# Patient Record
Sex: Male | Born: 1985
Health system: Southern US, Community
[De-identification: ages and names within clinical notes are randomized; demographics above are authoritative.]

## PROBLEM LIST (undated history)

## (undated) DIAGNOSIS — G8929 Other chronic pain: Secondary | ICD-10-CM

## (undated) DIAGNOSIS — W3400XA Accidental discharge from unspecified firearms or gun, initial encounter: Secondary | ICD-10-CM

## (undated) DIAGNOSIS — A64 Unspecified sexually transmitted disease: Secondary | ICD-10-CM

## (undated) DIAGNOSIS — R519 Headache, unspecified: Secondary | ICD-10-CM

## (undated) DIAGNOSIS — R51 Headache: Secondary | ICD-10-CM

## (undated) DIAGNOSIS — I10 Essential (primary) hypertension: Secondary | ICD-10-CM

## (undated) HISTORY — PX: OTHER SURGICAL HISTORY: SHX169

## (undated) HISTORY — PX: WISDOM TOOTH EXTRACTION: SHX21

---

## 2012-06-20 ENCOUNTER — Encounter (HOSPITAL_BASED_OUTPATIENT_CLINIC_OR_DEPARTMENT_OTHER): Payer: Self-pay

## 2012-06-20 ENCOUNTER — Emergency Department (HOSPITAL_BASED_OUTPATIENT_CLINIC_OR_DEPARTMENT_OTHER)
Admission: EM | Admit: 2012-06-20 | Discharge: 2012-06-20 | Disposition: A | Discharge status: Home / Self Care | Payer: Medicaid Other | Attending: Emergency Medicine | Admitting: Emergency Medicine

## 2012-06-20 DIAGNOSIS — Z202 Contact with and (suspected) exposure to infections with a predominantly sexual mode of transmission: Secondary | ICD-10-CM | POA: Insufficient documentation

## 2012-06-20 DIAGNOSIS — N509 Disorder of male genital organs, unspecified: Secondary | ICD-10-CM | POA: Insufficient documentation

## 2012-06-20 DIAGNOSIS — Z8589 Personal history of malignant neoplasm of other organs and systems: Secondary | ICD-10-CM | POA: Insufficient documentation

## 2012-06-20 DIAGNOSIS — F17200 Nicotine dependence, unspecified, uncomplicated: Secondary | ICD-10-CM | POA: Insufficient documentation

## 2012-06-20 DIAGNOSIS — N4889 Other specified disorders of penis: Secondary | ICD-10-CM

## 2012-06-20 DIAGNOSIS — Z79899 Other long term (current) drug therapy: Secondary | ICD-10-CM | POA: Insufficient documentation

## 2012-06-20 HISTORY — DX: Unspecified sexually transmitted disease: A64

## 2012-06-20 NOTE — ED Notes (Signed)
Pt states that he had onset of penile pain yesterday morning at the end of his penis.  Pt states that he had a bump on the end of his penis, but he put neosporin on it and it subsided.  Pt states that severe pain persists.

## 2012-06-20 NOTE — ED Provider Notes (Signed)
History     CSN: [id number]  Arrival date & time 06/20/12  1354   First MD Initiated Contact with Patient 06/20/12 1444      Chief Complaint  Patient presents with  . Penis Pain  . Exposure to STD    (Consider location/radiation/quality/duration/timing/severity/associated sxs/prior treatment) HPI Comments: Patient presents today with a chief complaint of penile pain of the urethral meatus when urinating.  He states that he has noticed the pain at the end of the stream since yesterday.  He has had STD's in the past and reports that his symptoms feel similar to when he has had a STD.  He is currently sexually active and does not use protection.  He is unsure if his partner has a STD.  He denies penile discharge.  No scrotal or testicular pain or swelling.    No fever or chills.    The history is provided by the patient.    Past Medical History  Diagnosis Date  . STD (sexually transmitted disease)     Past Surgical History  Procedure Laterality Date  . Wisdom tooth extraction      No family history on file.  History  Substance Use Topics  . Smoking status: Current Every Day Smoker    Types: Cigars  . Smokeless tobacco: Never Used  . Alcohol Use: No      Review of Systems  Constitutional: Negative for fever and chills.  Genitourinary: Positive for penile pain. Negative for urgency, hematuria, decreased urine volume, discharge, penile swelling, scrotal swelling and testicular pain.    Allergies  Review of patient's allergies indicates no known allergies.  Home Medications   Current Outpatient Rx  Name  Route  Sig  Dispense  Refill  . cetirizine (ZYRTEC) 10 MG tablet   Oral   Take 10 mg by mouth daily.           BP 138/88  Pulse 59  Temp(Src) 98.2 F (36.8 C) (Oral)  Resp 16  Ht 6\' 2"  (1.88 m)  Wt 245 lb (111.131 kg)  BMI 31.44 kg/m2  SpO2 100%  Physical Exam  Nursing note and vitals reviewed. Constitutional: He appears well-developed and  well-nourished.  Cardiovascular: Normal rate and normal heart sounds.   Pulmonary/Chest: Effort normal and breath sounds normal.  Abdominal: Normal appearance and bowel sounds are normal. There is no tenderness.  Genitourinary: Testes normal and penis normal. Right testis shows no mass, no swelling and no tenderness. Left testis shows no mass, no swelling and no tenderness. Circumcised. No penile erythema or penile tenderness. No discharge found.  Chaperone present during GU exam  Lymphadenopathy:       Right: No inguinal adenopathy present.       Left: No inguinal adenopathy present.  Neurological: He is alert.  Skin: Skin is warm and dry.  Psychiatric: He has a normal mood and affect.    ED Course  Procedures (including critical care time)  Labs Reviewed  GC/CHLAMYDIA PROBE AMP   No results found.   No diagnosis found.    MDM  Patient presenting with concern that he may have a STD.  Some pain of the urethral meatus at the end of his urinary stream.  History of the same with STD's.  GC/Chlamydia pending.  He stated that he did not want prophylactic treatment in the ED today and wanted to wait for results.        Pascal Lux [place], PA-C 06/21/12 1020

## 2012-06-22 LAB — GC/CHLAMYDIA PROBE AMP: CT Probe RNA: NEGATIVE

## 2012-06-23 NOTE — ED Provider Notes (Signed)
Medical screening examination/treatment/procedure(s) were performed by non-physician practitioner and as supervising physician I was immediately available for consultation/collaboration.   Zoraida Havrilla, MD 06/23/12 0707 

## 2013-07-28 ENCOUNTER — Emergency Department (HOSPITAL_BASED_OUTPATIENT_CLINIC_OR_DEPARTMENT_OTHER): Payer: Medicaid Other

## 2013-07-28 ENCOUNTER — Encounter (HOSPITAL_BASED_OUTPATIENT_CLINIC_OR_DEPARTMENT_OTHER): Payer: Self-pay | Admitting: Emergency Medicine

## 2013-07-28 ENCOUNTER — Emergency Department (HOSPITAL_BASED_OUTPATIENT_CLINIC_OR_DEPARTMENT_OTHER)
Admission: EM | Admit: 2013-07-28 | Discharge: 2013-07-28 | Disposition: A | Discharge status: Home / Self Care | Payer: Medicaid Other | Attending: Emergency Medicine | Admitting: Emergency Medicine

## 2013-07-28 DIAGNOSIS — F17200 Nicotine dependence, unspecified, uncomplicated: Secondary | ICD-10-CM | POA: Insufficient documentation

## 2013-07-28 DIAGNOSIS — I10 Essential (primary) hypertension: Secondary | ICD-10-CM | POA: Insufficient documentation

## 2013-07-28 DIAGNOSIS — J3489 Other specified disorders of nose and nasal sinuses: Secondary | ICD-10-CM | POA: Insufficient documentation

## 2013-07-28 DIAGNOSIS — J189 Pneumonia, unspecified organism: Secondary | ICD-10-CM | POA: Insufficient documentation

## 2013-07-28 DIAGNOSIS — Z8619 Personal history of other infectious and parasitic diseases: Secondary | ICD-10-CM | POA: Insufficient documentation

## 2013-07-28 HISTORY — DX: Essential (primary) hypertension: I10

## 2013-07-28 MED ORDER — HYDROCODONE-ACETAMINOPHEN 7.5-325 MG/15ML PO SOLN: 10.0000 mL | Freq: Four times a day (QID) | ORAL | Status: DC | PRN

## 2013-07-28 MED ORDER — AZITHROMYCIN 250 MG PO TABS
500.0000 mg | ORAL_TABLET | Freq: Once | ORAL | Status: AC
  Administered 2013-07-28: 500 mg via ORAL
  Filled 2013-07-28: qty 2

## 2013-07-28 MED ORDER — AZITHROMYCIN 250 MG PO TABS: 250.0000 mg | ORAL_TABLET | Freq: Every day | ORAL | Status: DC

## 2013-07-28 MED ORDER — HYDROCOD POLST-CHLORPHEN POLST 10-8 MG/5ML PO LQCR
5.0000 mL | Freq: Once | ORAL | Status: AC
  Administered 2013-07-28: 5 mL via ORAL
  Filled 2013-07-28: qty 5

## 2013-07-28 NOTE — ED Notes (Signed)
Pt presents with cough x 3-4 days.  Pt reports muscle pain in chest.  Denies sick exposure.

## 2013-07-28 NOTE — ED Provider Notes (Signed)
CSN: [id number]     Arrival date & time 07/28/13  1906 History   First MD Initiated Contact with Patient 07/28/13 1918     Chief Complaint  Patient presents with  . URI     (Consider location/radiation/quality/duration/timing/severity/associated sxs/prior Treatment) HPI Comments: 28 year old presents with 5 days of cough and chest tightness.  He has been coughing up green sputum without any blood and has a sore throat.  He has a runny nose, but denies runny eyes or ear pain.  His daughter has recently had a cough.  He says that when he coughs he feels chest tightness, but that it does not radiate up his jaw or down his arm.  He denies SOB, fevers, nausea, vomiting or abdominal pain.  He tried taking benedryl with no relief.  He is unsure as to whether he ever had seasonal allergies. He also states that he has had "a lot of things going on at home recently."   Patient is a 28 y.o. male presenting with URI. The history is provided by the patient. No language interpreter was used.  URI Presenting symptoms: cough, rhinorrhea and sore throat   Presenting symptoms: no congestion, no ear pain and no fever   Associated symptoms: no neck pain and no wheezing       Past Medical History  Diagnosis Date  . STD (sexually transmitted disease)   . Hypertension    Past Surgical History  Procedure Laterality Date  . Wisdom tooth extraction     History reviewed. No pertinent family history. History  Substance Use Topics  . Smoking status: Current Every Day Smoker    Types: Cigars  . Smokeless tobacco: Never Used  . Alcohol Use: No    Review of Systems  Constitutional: Negative for fever, chills and appetite change.  HENT: Positive for rhinorrhea and sore throat. Negative for congestion, ear pain, postnasal drip, sinus pressure and trouble swallowing.   Eyes: Negative for discharge.  Respiratory: Positive for cough and chest tightness. Negative for shortness of breath and wheezing.    Cardiovascular: Positive for chest pain.       Chest pain is worse when he coughs  Gastrointestinal: Negative for nausea, vomiting, diarrhea, constipation and blood in stool.  Musculoskeletal: Negative for neck pain and neck stiffness.  Allergic/Immunologic: Positive for environmental allergies.       He hasn't had allergies in the past.  Neurological: Negative for dizziness, light-headedness and numbness.      Allergies  Review of patient's allergies indicates no known allergies.  Home Medications   Prior to Admission medications   Medication Sig Start Date End Date Taking? Authorizing Provider  cetirizine (ZYRTEC) 10 MG tablet Take 10 mg by mouth daily.    Historical Provider, MD   BP 135/99  Pulse 67  Temp(Src) 98.7 F (37.1 C) (Oral)  Resp 16  Ht 6\' 1"  (1.854 m)  Wt 241 lb (109.317 kg)  BMI 31.80 kg/m2  SpO2 100% Physical Exam  Constitutional: He is oriented to person, place, and time. He appears well-developed and well-nourished. No distress.  HENT:  Head: Normocephalic and atraumatic.  Eyes: Conjunctivae and EOM are normal. Pupils are equal, round, and reactive to light.  Neck: Normal range of motion. Neck supple.  Cardiovascular: Normal rate, regular rhythm and normal heart sounds.   Pulmonary/Chest: Effort normal and breath sounds normal. No respiratory distress. He has no wheezes. He has no rales. He exhibits tenderness.  Chest wall pain is reproducible on palpation  Abdominal: Soft. There is tenderness.  Abdomen is diffusely tender to palpation   Neurological: He is alert and oriented to person, place, and time.  Psychiatric: He has a normal mood and affect. Thought content normal.    ED Course  Procedures (including critical care time) Labs Review Labs Reviewed - No data to display  Imaging Review No results found.   EKG Interpretation None     Dg Chest 2 View  07/28/2013   CLINICAL DATA:  Cough, congestion  EXAM: CHEST  2 VIEW  COMPARISON:   None.  FINDINGS: Left lower lobe retrocardiac focal density noted compatible with left lower lobe pneumonia. Right lung clear. Normal heart size and vascularity. No edema, effusion or pneumothorax. Trachea midline.  IMPRESSION: Left lower lobe retrocardiac pneumonia. Recommend radiographic followup to document resolution following treatment.   Electronically Signed   By: Ruel Favorsrevor  Shick M.D.   On: 07/28/2013 19:40   MDM   Final diagnoses:  None    1. CAP  He is stable, no hypoxia, tachycardia, tachypnea. Abx started, cough medication provided.    Arnoldo HookerShari A Zakeria Kulzer, PA-C 08/01/13 1248

## 2013-07-28 NOTE — Discharge Instructions (Signed)

## 2013-07-28 NOTE — ED Notes (Signed)
Pt c/o URi symptoms with congestion x 1 week,

## 2013-08-02 NOTE — ED Provider Notes (Signed)
Medical screening examination/treatment/procedure(s) were performed by non-physician practitioner and as supervising physician I was immediately available for consultation/collaboration.   EKG Interpretation None        Charles B. Bernette MayersSheldon, MD 08/02/13 1249

## 2014-06-14 ENCOUNTER — Emergency Department (HOSPITAL_BASED_OUTPATIENT_CLINIC_OR_DEPARTMENT_OTHER): Payer: Medicaid Other

## 2014-06-14 ENCOUNTER — Emergency Department (HOSPITAL_BASED_OUTPATIENT_CLINIC_OR_DEPARTMENT_OTHER)
Admission: EM | Admit: 2014-06-14 | Discharge: 2014-06-15 | Disposition: A | Discharge status: Home / Self Care | Payer: Medicaid Other | Attending: Emergency Medicine | Admitting: Emergency Medicine

## 2014-06-14 ENCOUNTER — Encounter (HOSPITAL_BASED_OUTPATIENT_CLINIC_OR_DEPARTMENT_OTHER): Payer: Self-pay | Admitting: *Deleted

## 2014-06-14 DIAGNOSIS — K2920 Alcoholic gastritis without bleeding: Secondary | ICD-10-CM

## 2014-06-14 DIAGNOSIS — Y998 Other external cause status: Secondary | ICD-10-CM | POA: Diagnosis not present

## 2014-06-14 DIAGNOSIS — T7840XA Allergy, unspecified, initial encounter: Secondary | ICD-10-CM | POA: Diagnosis not present

## 2014-06-14 DIAGNOSIS — Y9289 Other specified places as the place of occurrence of the external cause: Secondary | ICD-10-CM | POA: Diagnosis not present

## 2014-06-14 DIAGNOSIS — Z79899 Other long term (current) drug therapy: Secondary | ICD-10-CM | POA: Diagnosis not present

## 2014-06-14 DIAGNOSIS — R079 Chest pain, unspecified: Secondary | ICD-10-CM | POA: Diagnosis present

## 2014-06-14 DIAGNOSIS — Z792 Long term (current) use of antibiotics: Secondary | ICD-10-CM | POA: Insufficient documentation

## 2014-06-14 DIAGNOSIS — Z720 Tobacco use: Secondary | ICD-10-CM | POA: Insufficient documentation

## 2014-06-14 DIAGNOSIS — X58XXXA Exposure to other specified factors, initial encounter: Secondary | ICD-10-CM | POA: Diagnosis not present

## 2014-06-14 DIAGNOSIS — I10 Essential (primary) hypertension: Secondary | ICD-10-CM | POA: Diagnosis not present

## 2014-06-14 DIAGNOSIS — Z8619 Personal history of other infectious and parasitic diseases: Secondary | ICD-10-CM | POA: Insufficient documentation

## 2014-06-14 DIAGNOSIS — R109 Unspecified abdominal pain: Secondary | ICD-10-CM

## 2014-06-14 DIAGNOSIS — Y9389 Activity, other specified: Secondary | ICD-10-CM | POA: Diagnosis not present

## 2014-06-14 DIAGNOSIS — K7010 Alcoholic hepatitis without ascites: Secondary | ICD-10-CM | POA: Insufficient documentation

## 2014-06-14 LAB — COMPREHENSIVE METABOLIC PANEL
ALK PHOS: 182 U/L — AB (ref 39–117)
ALT: 313 U/L — AB (ref 0–53)
AST: 119 U/L — AB (ref 0–37)
Albumin: 4.6 g/dL (ref 3.5–5.2)
Anion gap: 11 (ref 5–15)
BILIRUBIN TOTAL: 4.8 mg/dL — AB (ref 0.3–1.2)
BUN: 8 mg/dL (ref 6–23)
CALCIUM: 9.6 mg/dL (ref 8.4–10.5)
CO2: 27 mmol/L (ref 19–32)
Chloride: 98 mmol/L (ref 96–112)
Creatinine, Ser: 1.04 mg/dL (ref 0.50–1.35)
GFR calc non Af Amer: >90 mL/min (ref >90)
GLUCOSE: 109 mg/dL — AB (ref 70–99)
POTASSIUM: 3.8 mmol/L (ref 3.5–5.1)
SODIUM: 136 mmol/L (ref 135–145)
Total Protein: 9 g/dL — ABNORMAL HIGH (ref 6.0–8.3)

## 2014-06-14 LAB — TROPONIN I

## 2014-06-14 LAB — CBC WITH DIFFERENTIAL/PLATELET
BASOS PCT: 0 % (ref 0–1)
Basophils Absolute: 0 10*3/uL (ref 0.0–0.1)
Eosinophils Absolute: 0.5 10*3/uL (ref 0.0–0.7)
Eosinophils Relative: 4 % (ref 0–5)
HCT: 43.9 % (ref 39.0–52.0)
Hemoglobin: 15.1 g/dL (ref 13.0–17.0)
LYMPHS PCT: 11 % — AB (ref 12–46)
Lymphs Abs: 1.3 10*3/uL (ref 0.7–4.0)
MCH: 29.3 pg (ref 26.0–34.0)
MCHC: 34.4 g/dL (ref 30.0–36.0)
MCV: 85.2 fL (ref 78.0–100.0)
MONOS PCT: 11 % (ref 3–12)
Monocytes Absolute: 1.3 10*3/uL — ABNORMAL HIGH (ref 0.1–1.0)
NEUTROS ABS: 8.1 10*3/uL — AB (ref 1.7–7.7)
Neutrophils Relative %: 74 % (ref 43–77)
PLATELETS: 348 10*3/uL (ref 150–400)
RBC: 5.15 MIL/uL (ref 4.22–5.81)
RDW: 12.8 % (ref 11.5–15.5)
WBC: 11.1 10*3/uL — ABNORMAL HIGH (ref 4.0–10.5)

## 2014-06-14 LAB — LIPASE, BLOOD: Lipase: 23 U/L (ref 11–59)

## 2014-06-14 MED ORDER — PREDNISONE 50 MG PO TABS
60.0000 mg | ORAL_TABLET | Freq: Once | ORAL | Status: AC
  Administered 2014-06-15: 60 mg via ORAL
  Filled 2014-06-14 (×2): qty 1

## 2014-06-14 MED ORDER — PREDNISONE 20 MG PO TABS: 40.0000 mg | ORAL_TABLET | Freq: Every day | ORAL | Status: DC

## 2014-06-14 MED ORDER — DIPHENHYDRAMINE HCL 50 MG/ML IJ SOLN
25.0000 mg | Freq: Once | INTRAMUSCULAR | Status: AC
  Administered 2014-06-15: 25 mg via INTRAVENOUS
  Filled 2014-06-14: qty 1

## 2014-06-14 MED ORDER — GI COCKTAIL ~~LOC~~
30.0000 mL | Freq: Once | ORAL | Status: AC
  Administered 2014-06-14: 30 mL via ORAL
  Filled 2014-06-14: qty 30

## 2014-06-14 MED ORDER — RANITIDINE HCL 150 MG PO TABS: 150.0000 mg | ORAL_TABLET | Freq: Two times a day (BID) | ORAL | Status: DC

## 2014-06-14 MED ORDER — OXYCODONE HCL 5 MG PO TABS: 5.0000 mg | ORAL_TABLET | ORAL | Status: DC | PRN

## 2014-06-14 NOTE — ED Provider Notes (Addendum)
CSN: [id number]     Arrival date & time 06/14/14  2054 History  This chart was scribed for Gwyneth Sprout, MD by Gwenyth Ober, ED Scribe. This patient was seen in room MH10/MH10 and the patient's care was started at 9:12 PM.    Chief Complaint  Patient presents with  . Chest Pain   The history is provided by the patient. No language interpreter was used.    HPI Comments: Sean Castillo is a 29 y.o. male with a history of HTN who presents to the Emergency Department complaining of constant, moderate upper abdominal pain that started earlier today after he ate [employer]. Pt states CP that occurs with belching, constipation, subjective fever and rash on his arms and back as well as nausea but no vomiting today as associated symptoms. He notes the onset of rash started after taking Zofran and shaving his arms. Pt was seen by [facility] yesterday for vomiting and abdominal pain and had a [place] that showed liver damage. He also reports a previous lung test and was told he had "27 year old lungs." Pt states he yells and is stressed a lot. He denies recent surgeries. Pt endorses tobacco and EtOH use, but denies consumption of EtOH PTA. Pt denies drug abuse. He also denies dysuria as associated symptoms.     Past Medical History  Diagnosis Date  . STD (sexually transmitted disease)   . Hypertension    Past Surgical History  Procedure Laterality Date  . Wisdom tooth extraction     No family history on file. History  Substance Use Topics  . Smoking status: Current Every Day Smoker    Types: Cigars  . Smokeless tobacco: Never Used  . Alcohol Use: No    Review of Systems  Constitutional: Positive for fever.  Cardiovascular: Positive for chest pain.  Gastrointestinal: Positive for abdominal pain and constipation. Negative for nausea and vomiting.  Genitourinary: Negative for dysuria.  Skin: Positive for rash.  All other systems reviewed and are negative.   Allergies  Review of patient's  allergies indicates no known allergies.  Home Medications   Prior to Admission medications   Medication Sig Start Date End Date Taking? Authorizing Provider  Ondansetron HCl (ZOFRAN PO) Take by mouth.   Yes Historical Provider, MD  azithromycin (ZITHROMAX Z-PAK) 250 MG tablet Take 1 tablet (250 mg total) by mouth daily. 07/28/13   Elpidio Anis, PA-C  cetirizine (ZYRTEC) 10 MG tablet Take 10 mg by mouth daily.    Historical Provider, MD  HYDROcodone-acetaminophen (HYCET) 7.5-325 mg/15 ml solution Take 10 mLs by mouth 4 (four) times daily as needed for moderate pain. 07/28/13   Shari Upstill, PA-C   BP 149/91 mmHg  Pulse 71  Temp(Src) 99.1 F (37.3 C) (Oral)  Resp 20  Ht  (1.854 m)  Wt 241 lb (109.317 kg)  BMI 31.80 kg/m2  SpO2 100% Physical Exam  Constitutional: He is oriented to person, place, and time. He appears well-developed and well-nourished. No distress.  HENT:  Head: Normocephalic and atraumatic.  Eyes: Conjunctivae and EOM are normal.  Neck: Neck supple. No tracheal deviation present.  Cardiovascular: Normal rate, regular rhythm, normal heart sounds and intact distal pulses.   No murmur heard. Pulmonary/Chest: Effort normal and breath sounds normal. No respiratory distress. He exhibits no tenderness.  No chest wall tenderness  Abdominal: Soft. There is tenderness. There is no rebound and no guarding.  Epigastric tenderness  Musculoskeletal: Normal range of motion. He exhibits no edema or tenderness.  Neurological: He is alert and oriented to person, place, and time.  Skin: Skin is warm and dry. Rash noted. No erythema.  Diffuse maculopapular rash over the upper extremities and trunk. Noted to have areas of excoriation but no fluctuance, erythema or draining  Psychiatric: He has a normal mood and affect. His behavior is normal.  Nursing note and vitals reviewed.   ED Course  Procedures   DIAGNOSTIC STUDIES: Oxygen Saturation is 100% on RA, normal by my  interpretation.    COORDINATION OF CARE: 9:29 PM Discussed treatment plan with pt which includes a GI cocktail. Pt agreed to plan.   Labs Review Labs Reviewed  CBC WITH DIFFERENTIAL/PLATELET - Abnormal; Notable for the following:    WBC 11.1 (*)    Neutro Abs 8.1 (*)    Lymphocytes Relative 11 (*)    Monocytes Absolute 1.3 (*)    All other components within normal limits  COMPREHENSIVE METABOLIC PANEL - Abnormal; Notable for the following:    Glucose, Bld 109 (*)    Total Protein 9.0 (*)    AST 119 (*)    ALT 313 (*)    Alkaline Phosphatase 182 (*)    Total Bilirubin 4.8 (*)    All other components within normal limits  LIPASE, BLOOD  TROPONIN I  TROPONIN I  TROPONIN I    Imaging Review Dg Abd Acute W/chest  06/14/2014   CLINICAL DATA:  Acute onset of generalized chest pain, worse with belching. Nausea and vomiting. Initial encounter.  EXAM: ACUTE ABDOMEN SERIES (ABDOMEN 2 VIEW & CHEST 1 VIEW)  COMPARISON:  Chest radiograph performed 07/28/2013, and CT of the abdomen and pelvis from 08/02/2012  FINDINGS: The lungs are well-aerated. Mild vascular congestion is noted. Minimal left basilar atelectasis or scarring is seen. There is no evidence of pleural effusion or pneumothorax. The cardiomediastinal silhouette is borderline normal in size.  The visualized bowel gas pattern is unremarkable. Scattered stool and air are seen within the colon; there is no evidence of small bowel dilatation to suggest obstruction. No free intra-abdominal air is identified on the provided upright view.  No acute osseous abnormalities are seen; the sacroiliac joints are unremarkable in appearance.  IMPRESSION: 1. Unremarkable bowel gas pattern; no free intra-abdominal air seen. Small to moderate amount of stool noted in the colon. 2. Mild vascular congestion noted. Minimal left basilar atelectasis or scarring seen.   Electronically Signed   By: Roanna Raider M.D.   On: 06/14/2014 23:22     EKG  Interpretation   Date/Time:  Tuesday June 14 2014 21:02:43 EDT Ventricular Rate:  72 PR Interval:  164 QRS Duration: 104 QT Interval:  364 QTC Calculation: 398 R Axis:   84 Text Interpretation:  Normal sinus rhythm ST elevation consider lateral  injury or acute infarct Incomplete right bundle branch block T wave  inversion Inferior leads No previous tracing Confirmed by Anitra Lauth  MD,  Alphonzo Lemmings ([id number]) on 06/14/2014 10:12:11 PM      MDM   Final diagnoses:  Abdominal pain  Alcoholic hepatitis without ascites  Alcoholic gastritis  Allergic reaction, initial encounter   patient with a history of heavy alcohol use he was seen at [facility] last night for abdominal pain, vomiting and diarrhea. At that time patient had elevated LFTs AST 332 and ALT 477 with otherwise a normal lipase and a white blood cell count of 12,000. He had an abdominal ultrasound that showed hepatic steatosis and a gallbladder polyp. He was discharged home  with Zofran and states today when he woke up he had broken out in a rash which could've been from the medication wore a new cologne he had used. However he states his abdomen continues to hurt and when he burps he gets pain in his chest. He denies any ongoing chest pain or shortness of breath. He states most of his abdominal his pain is in his abdomen. Patient does have an abnormal EKG with an incomplete right bundle branch block and T-wave inversion without an old to compare however low suspicion the patient's symptoms are cardiac related and feel most likely at alcoholic gastritis and hepatitis. Patient's troponin was negative. CBC with improving leukocytosis now 11,000 and CMP with improving LFTs down to AST 100 and ALT 300.  Lipase is normal here. Patient given GI cocktail. Acute abdominal series within normal limits. Patient encouraged to continue oral rehydration and given pain medication.  Also patient has diffuse rash over his body and complaining of  itching. He was started on Benadryl as prednisone as this is most likely an allergic reaction to Zofran.  I personally performed the services described in this documentation, which was scribed in my presence.  The recorded information has been reviewed and considered.    Gwyneth SproutWhitney Iban Utz, MD 06/14/14 [phone]  Gwyneth SproutWhitney Scotland Shellhammer, MD 06/14/14 [phone]

## 2014-06-14 NOTE — ED Notes (Signed)
Abdominal pain. He had an US yesterday that showed liver damage from drinking alcohol. Today he started having chest pain. Pain feels like gas. Hives on his arms and back since 1am. It has been on and off today. The rash started after taking Zofran. He wants something to heal his damaged liver. His doctor told him the damage was done.

## 2014-06-15 ENCOUNTER — Emergency Department (HOSPITAL_BASED_OUTPATIENT_CLINIC_OR_DEPARTMENT_OTHER)
Admission: EM | Admit: 2014-06-15 | Discharge: 2014-06-15 | Disposition: A | Discharge status: Home / Self Care | Payer: Medicaid Other | Source: Home / Self Care | Attending: Emergency Medicine | Admitting: Emergency Medicine

## 2014-06-15 ENCOUNTER — Encounter (HOSPITAL_BASED_OUTPATIENT_CLINIC_OR_DEPARTMENT_OTHER): Payer: Self-pay

## 2014-06-15 DIAGNOSIS — R079 Chest pain, unspecified: Secondary | ICD-10-CM | POA: Insufficient documentation

## 2014-06-15 DIAGNOSIS — R945 Abnormal results of liver function studies: Secondary | ICD-10-CM | POA: Insufficient documentation

## 2014-06-15 DIAGNOSIS — I10 Essential (primary) hypertension: Secondary | ICD-10-CM | POA: Insufficient documentation

## 2014-06-15 DIAGNOSIS — R1013 Epigastric pain: Secondary | ICD-10-CM | POA: Insufficient documentation

## 2014-06-15 DIAGNOSIS — Z720 Tobacco use: Secondary | ICD-10-CM | POA: Insufficient documentation

## 2014-06-15 DIAGNOSIS — Z8619 Personal history of other infectious and parasitic diseases: Secondary | ICD-10-CM | POA: Insufficient documentation

## 2014-06-15 DIAGNOSIS — R7989 Other specified abnormal findings of blood chemistry: Secondary | ICD-10-CM

## 2014-06-15 MED ORDER — MORPHINE SULFATE 4 MG/ML IJ SOLN
4.0000 mg | Freq: Once | INTRAMUSCULAR | Status: DC
  Filled 2014-06-15: qty 1

## 2014-06-15 MED ORDER — KETOROLAC TROMETHAMINE 30 MG/ML IJ SOLN
30.0000 mg | Freq: Once | INTRAMUSCULAR | Status: DC
  Filled 2014-06-15: qty 1

## 2014-06-15 NOTE — ED Notes (Signed)
C/o CP x 20 min-seen here for same yesterday

## 2014-06-15 NOTE — Discharge Instructions (Signed)
Follow-up with your primary Dr. tomorrow to discuss a referral to a gastroenterologist.   Chest Pain (Nonspecific) It is often hard to give a specific diagnosis for the cause of chest pain. There is always a chance that your pain could be related to something serious, such as a heart attack or a blood clot in the lungs. You need to follow up with your health care provider for further evaluation. CAUSES   Heartburn.  Pneumonia or bronchitis.  Anxiety or stress.  Inflammation around your heart (pericarditis) or lung (pleuritis or pleurisy).  A blood clot in the lung.  A collapsed lung (pneumothorax). It can develop suddenly on its own (spontaneous pneumothorax) or from trauma to the chest.  Shingles infection (herpes zoster virus). The chest wall is composed of bones, muscles, and cartilage. Any of these can be the source of the pain.  The bones can be bruised by injury.  The muscles or cartilage can be strained by coughing or overwork.  The cartilage can be affected by inflammation and become sore (costochondritis). DIAGNOSIS  Lab tests or other studies may be needed to find the cause of your pain. Your health care provider may have you take a test called an ambulatory electrocardiogram (ECG). An ECG records your heartbeat patterns over a 24-hour period. You may also have other tests, such as:  Transthoracic echocardiogram (TTE). During echocardiography, sound waves are used to evaluate how blood flows through your heart.  Transesophageal echocardiogram (TEE).  Cardiac monitoring. This allows your health care provider to monitor your heart rate and rhythm in real time.  Holter monitor. This is a portable device that records your heartbeat and can help diagnose heart arrhythmias. It allows your health care provider to track your heart activity for several days, if needed.  Stress tests by exercise or by giving medicine that makes the heart beat faster. TREATMENT   Treatment  depends on what may be causing your chest pain. Treatment may include:  Acid blockers for heartburn.  Anti-inflammatory medicine.  Pain medicine for inflammatory conditions.  Antibiotics if an infection is present.  You may be advised to change lifestyle habits. This includes stopping smoking and avoiding alcohol, caffeine, and chocolate.  You may be advised to keep your head raised (elevated) when sleeping. This reduces the chance of acid going backward from your stomach into your esophagus. Most of the time, nonspecific chest pain will improve within 2-3 days with rest and mild pain medicine.  HOME CARE INSTRUCTIONS   If antibiotics were prescribed, take them as directed. Finish them even if you start to feel better.  For the next few days, avoid physical activities that bring on chest pain. Continue physical activities as directed.  Do not use any tobacco products, including cigarettes, chewing tobacco, or electronic cigarettes.  Avoid drinking alcohol.  Only take medicine as directed by your health care provider.  Follow your health care provider's suggestions for further testing if your chest pain does not go away.  Keep any follow-up appointments you made. If you do not go to an appointment, you could develop lasting (chronic) problems with pain. If there is any problem keeping an appointment, call to reschedule. SEEK MEDICAL CARE IF:   Your chest pain does not go away, even after treatment.  You have a rash with blisters on your chest.  You have a fever. SEEK IMMEDIATE MEDICAL CARE IF:   You have increased chest pain or pain that spreads to your arm, neck, jaw, back, or abdomen.  You have shortness of breath.  You have an increasing cough, or you cough up blood.  You have severe back or abdominal pain.  You feel nauseous or vomit.  You have severe weakness.  You faint.  You have chills. This is an emergency. Do not wait to see if the pain will go away. Get  medical help at once. Call your local emergency services (911 in [place] Do not drive yourself to the hospital. MAKE SURE YOU:   Understand these instructions.  Will watch your condition.  Will get help right away if you are not doing well or get worse. Document Released: 12/12/2004 Document Revised: 03/09/2013 Document Reviewed: 10/08/2007 [facility] Patient Information 2015 [place], [place]. This information is not intended to replace advice given to you by your health care provider. Make sure you discuss any questions you have with your health care provider.
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
## 2014-06-15 NOTE — ED Notes (Signed)
Pt spoke with EDP Delo, states wants to leave and get his rx meds filled that he received last night; pt in no distress

## 2015-12-09 ENCOUNTER — Emergency Department (HOSPITAL_BASED_OUTPATIENT_CLINIC_OR_DEPARTMENT_OTHER): Payer: Medicaid Other

## 2015-12-09 ENCOUNTER — Encounter (HOSPITAL_BASED_OUTPATIENT_CLINIC_OR_DEPARTMENT_OTHER): Payer: Self-pay | Admitting: *Deleted

## 2015-12-09 ENCOUNTER — Emergency Department (HOSPITAL_BASED_OUTPATIENT_CLINIC_OR_DEPARTMENT_OTHER)
Admission: EM | Admit: 2015-12-09 | Discharge: 2015-12-09 | Disposition: A | Discharge status: Home / Self Care | Payer: Medicaid Other | Attending: Emergency Medicine | Admitting: Emergency Medicine

## 2015-12-09 DIAGNOSIS — M79604 Pain in right leg: Secondary | ICD-10-CM

## 2015-12-09 DIAGNOSIS — Z79899 Other long term (current) drug therapy: Secondary | ICD-10-CM | POA: Insufficient documentation

## 2015-12-09 DIAGNOSIS — M25511 Pain in right shoulder: Secondary | ICD-10-CM | POA: Insufficient documentation

## 2015-12-09 DIAGNOSIS — Y92481 Parking lot as the place of occurrence of the external cause: Secondary | ICD-10-CM | POA: Diagnosis not present

## 2015-12-09 DIAGNOSIS — Y9389 Activity, other specified: Secondary | ICD-10-CM | POA: Insufficient documentation

## 2015-12-09 DIAGNOSIS — Y999 Unspecified external cause status: Secondary | ICD-10-CM | POA: Insufficient documentation

## 2015-12-09 DIAGNOSIS — Z87891 Personal history of nicotine dependence: Secondary | ICD-10-CM | POA: Insufficient documentation

## 2015-12-09 DIAGNOSIS — I10 Essential (primary) hypertension: Secondary | ICD-10-CM | POA: Diagnosis not present

## 2015-12-09 HISTORY — DX: Other chronic pain: G89.29

## 2015-12-09 HISTORY — DX: Headache: R51

## 2015-12-09 HISTORY — DX: Headache, unspecified: R51.9

## 2015-12-09 MED ORDER — ACETAMINOPHEN 325 MG PO TABS
650.0000 mg | ORAL_TABLET | Freq: Once | ORAL | Status: AC
  Administered 2015-12-09: 650 mg via ORAL
  Filled 2015-12-09: qty 2

## 2015-12-09 NOTE — Discharge Instructions (Signed)
Your x-rays today do not show any fractures or other acute abnormalities. You may take Tylenol and Advil for your pain. He may also apply ice 3 times daily for 20 minutes. Follow-up with your primary care physician. Return if you experience sudden severe worsening pain, numbness, weakness, or any new symptoms.
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
## 2015-12-11 ENCOUNTER — Emergency Department (HOSPITAL_BASED_OUTPATIENT_CLINIC_OR_DEPARTMENT_OTHER)
Admission: EM | Admit: 2015-12-11 | Discharge: 2015-12-11 | Disposition: A | Discharge status: Home / Self Care | Payer: Medicaid Other | Attending: Emergency Medicine | Admitting: Emergency Medicine

## 2015-12-11 ENCOUNTER — Encounter (HOSPITAL_BASED_OUTPATIENT_CLINIC_OR_DEPARTMENT_OTHER): Payer: Self-pay | Admitting: *Deleted

## 2015-12-11 DIAGNOSIS — Y929 Unspecified place or not applicable: Secondary | ICD-10-CM | POA: Insufficient documentation

## 2015-12-11 DIAGNOSIS — R51 Headache: Secondary | ICD-10-CM | POA: Diagnosis present

## 2015-12-11 DIAGNOSIS — Y939 Activity, unspecified: Secondary | ICD-10-CM | POA: Diagnosis not present

## 2015-12-11 DIAGNOSIS — Y999 Unspecified external cause status: Secondary | ICD-10-CM | POA: Diagnosis not present

## 2015-12-11 DIAGNOSIS — I10 Essential (primary) hypertension: Secondary | ICD-10-CM | POA: Diagnosis not present

## 2015-12-11 DIAGNOSIS — S060X0A Concussion without loss of consciousness, initial encounter: Secondary | ICD-10-CM | POA: Insufficient documentation

## 2015-12-11 DIAGNOSIS — Z87891 Personal history of nicotine dependence: Secondary | ICD-10-CM | POA: Insufficient documentation

## 2015-12-11 MED ORDER — METOCLOPRAMIDE HCL 10 MG PO TABS: 10.0000 mg | ORAL_TABLET | Freq: Three times a day (TID) | ORAL | 0 refills | Status: DC | PRN

## 2015-12-11 MED ORDER — KETOROLAC TROMETHAMINE 60 MG/2ML IM SOLN
60.0000 mg | Freq: Once | INTRAMUSCULAR | Status: DC
  Filled 2015-12-11: qty 2

## 2015-12-11 MED ORDER — DIPHENHYDRAMINE HCL 25 MG PO CAPS
50.0000 mg | ORAL_CAPSULE | Freq: Once | ORAL | Status: AC
  Administered 2015-12-11: 50 mg via ORAL
  Filled 2015-12-11: qty 2

## 2015-12-11 MED ORDER — METOCLOPRAMIDE HCL 10 MG PO TABS
10.0000 mg | ORAL_TABLET | Freq: Once | ORAL | Status: AC
  Administered 2015-12-11: 10 mg via ORAL
  Filled 2015-12-11: qty 1

## 2015-12-11 NOTE — ED Notes (Signed)
Pt requesting to speak to Dr. Mora Bellmanni about small knot to right parietal area.  Dr. Mora Bellmanni notified.
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
## 2015-12-11 NOTE — ED Notes (Signed)
Nurse first note:pt ambulatory carrying child. No weakness noted to either arm/shoulder.  Has FROM.
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
## 2015-12-11 NOTE — ED Notes (Signed)
Pt left without waiting on Dr. Mora Bellmanni to evaluate knot on head.  Dr. Mora Bellmanni attempted to go into room to see pt but he was not in room.

## 2015-12-11 NOTE — ED Notes (Signed)
Pt made aware to return if symptoms worsen or if any life threatening symptoms occur.   

## 2016-02-27 ENCOUNTER — Encounter (HOSPITAL_BASED_OUTPATIENT_CLINIC_OR_DEPARTMENT_OTHER): Payer: Self-pay

## 2016-02-27 ENCOUNTER — Emergency Department (HOSPITAL_BASED_OUTPATIENT_CLINIC_OR_DEPARTMENT_OTHER)
Admission: EM | Admit: 2016-02-27 | Discharge: 2016-02-27 | Disposition: A | Discharge status: Home / Self Care | Payer: Medicaid Other | Attending: Emergency Medicine | Admitting: Emergency Medicine

## 2016-02-27 DIAGNOSIS — R05 Cough: Secondary | ICD-10-CM | POA: Diagnosis present

## 2016-02-27 DIAGNOSIS — F17290 Nicotine dependence, other tobacco product, uncomplicated: Secondary | ICD-10-CM | POA: Insufficient documentation

## 2016-02-27 DIAGNOSIS — I10 Essential (primary) hypertension: Secondary | ICD-10-CM | POA: Diagnosis not present

## 2016-02-27 DIAGNOSIS — J111 Influenza due to unidentified influenza virus with other respiratory manifestations: Secondary | ICD-10-CM | POA: Insufficient documentation

## 2016-02-27 DIAGNOSIS — R69 Illness, unspecified: Secondary | ICD-10-CM

## 2016-02-27 MED ORDER — AEROCHAMBER PLUS W/MASK MISC
1.0000 | Freq: Once | Status: AC
  Administered 2016-02-27: 1
  Filled 2016-02-27: qty 1

## 2016-02-27 MED ORDER — ALBUTEROL SULFATE HFA 108 (90 BASE) MCG/ACT IN AERS
2.0000 | INHALATION_SPRAY | RESPIRATORY_TRACT | Status: DC | PRN
  Administered 2016-02-27: 2 via RESPIRATORY_TRACT
  Filled 2016-02-27: qty 6.7

## 2016-02-27 NOTE — ED Triage Notes (Signed)
Cough x 3 days-NAD-steady gait

## 2016-02-27 NOTE — Discharge Instructions (Signed)
Use your albuterol inhaler 2 puffs every 4 hours as needed for cough or shortness of breath. Take Tylenol for aches. Contact your primary care physician if not feeling better in a week. Return if concern for any reason. Ask your primary care physician to help you to stop smoking
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
## 2016-02-27 NOTE — ED Notes (Signed)
ED Provider at bedside. 

## 2017-03-18 ENCOUNTER — Other Ambulatory Visit: Payer: Self-pay

## 2017-03-18 ENCOUNTER — Emergency Department (HOSPITAL_BASED_OUTPATIENT_CLINIC_OR_DEPARTMENT_OTHER)
Admission: EM | Admit: 2017-03-18 | Discharge: 2017-03-18 | Disposition: A | Discharge status: Home / Self Care | Payer: Medicaid Other | Attending: Emergency Medicine | Admitting: Emergency Medicine

## 2017-03-18 ENCOUNTER — Encounter (HOSPITAL_BASED_OUTPATIENT_CLINIC_OR_DEPARTMENT_OTHER): Payer: Self-pay | Admitting: *Deleted

## 2017-03-18 DIAGNOSIS — F17290 Nicotine dependence, other tobacco product, uncomplicated: Secondary | ICD-10-CM | POA: Diagnosis not present

## 2017-03-18 DIAGNOSIS — J069 Acute upper respiratory infection, unspecified: Secondary | ICD-10-CM | POA: Diagnosis not present

## 2017-03-18 DIAGNOSIS — I10 Essential (primary) hypertension: Secondary | ICD-10-CM | POA: Insufficient documentation

## 2017-03-18 DIAGNOSIS — R05 Cough: Secondary | ICD-10-CM | POA: Diagnosis present

## 2017-03-18 DIAGNOSIS — B9789 Other viral agents as the cause of diseases classified elsewhere: Secondary | ICD-10-CM | POA: Diagnosis not present

## 2017-03-18 MED ORDER — MOMETASONE FUROATE 50 MCG/ACT NA SUSP: 2.0000 | Freq: Every day | NASAL | 12 refills | Status: AC

## 2017-03-18 NOTE — ED Triage Notes (Signed)
Fever, nausea, runny nose, and chills x 2 days.

## 2017-03-18 NOTE — Discharge Instructions (Signed)
Your symptoms are likely due to a virus and should resolve on their own.  Continue supportive care at home with OTC medications.  You may use your Nasonex as prescribed to help with nasal inflammation.  Follow-up with your doctor.  Return to ED for new or worsening symptoms as we discussed
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
## 2017-07-20 ENCOUNTER — Other Ambulatory Visit: Payer: Self-pay

## 2017-07-20 ENCOUNTER — Emergency Department (HOSPITAL_BASED_OUTPATIENT_CLINIC_OR_DEPARTMENT_OTHER): Payer: Medicaid Other

## 2017-07-20 ENCOUNTER — Encounter (HOSPITAL_BASED_OUTPATIENT_CLINIC_OR_DEPARTMENT_OTHER): Payer: Self-pay | Admitting: Emergency Medicine

## 2017-07-20 ENCOUNTER — Emergency Department (HOSPITAL_BASED_OUTPATIENT_CLINIC_OR_DEPARTMENT_OTHER)
Admission: EM | Admit: 2017-07-20 | Discharge: 2017-07-20 | Disposition: A | Discharge status: Home / Self Care | Payer: Medicaid Other | Attending: Emergency Medicine | Admitting: Emergency Medicine

## 2017-07-20 DIAGNOSIS — F17290 Nicotine dependence, other tobacco product, uncomplicated: Secondary | ICD-10-CM | POA: Insufficient documentation

## 2017-07-20 DIAGNOSIS — R05 Cough: Secondary | ICD-10-CM | POA: Diagnosis present

## 2017-07-20 DIAGNOSIS — B9789 Other viral agents as the cause of diseases classified elsewhere: Secondary | ICD-10-CM

## 2017-07-20 DIAGNOSIS — J069 Acute upper respiratory infection, unspecified: Secondary | ICD-10-CM | POA: Diagnosis not present

## 2017-07-20 DIAGNOSIS — I10 Essential (primary) hypertension: Secondary | ICD-10-CM | POA: Insufficient documentation

## 2017-07-20 HISTORY — DX: Accidental discharge from unspecified firearms or gun, initial encounter: W34.00XA

## 2017-07-20 MED ORDER — BENZONATATE 100 MG PO CAPS: 100.0000 mg | ORAL_CAPSULE | Freq: Three times a day (TID) | ORAL | 0 refills | Status: DC

## 2017-07-20 NOTE — ED Triage Notes (Signed)
Pt c/o cough and congestion x 5d
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
## 2017-07-20 NOTE — Discharge Instructions (Signed)
He can take over-the-counter Delsym to help with cough.  Take Tessalon Perles also to help with cough.  Make sure you are drinking plenty of fluids and staying hydrated.  Follow-up with your primary care doctor in the next 2 to 4 days for further evaluation.  Return the emergency department for any fever, persistent cough, chest pain, difficulty breathing, vomiting or any other worsening or concerning symptoms.

## 2018-01-09 ENCOUNTER — Other Ambulatory Visit: Payer: Self-pay

## 2018-01-09 ENCOUNTER — Emergency Department (HOSPITAL_BASED_OUTPATIENT_CLINIC_OR_DEPARTMENT_OTHER): Payer: Medicaid Other

## 2018-01-09 ENCOUNTER — Encounter (HOSPITAL_BASED_OUTPATIENT_CLINIC_OR_DEPARTMENT_OTHER): Payer: Self-pay

## 2018-01-09 ENCOUNTER — Emergency Department (HOSPITAL_BASED_OUTPATIENT_CLINIC_OR_DEPARTMENT_OTHER)
Admission: EM | Admit: 2018-01-09 | Discharge: 2018-01-09 | Disposition: A | Discharge status: Home / Self Care | Payer: Medicaid Other | Attending: Emergency Medicine | Admitting: Emergency Medicine

## 2018-01-09 DIAGNOSIS — I10 Essential (primary) hypertension: Secondary | ICD-10-CM | POA: Diagnosis not present

## 2018-01-09 DIAGNOSIS — R0789 Other chest pain: Secondary | ICD-10-CM | POA: Insufficient documentation

## 2018-01-09 DIAGNOSIS — Z79899 Other long term (current) drug therapy: Secondary | ICD-10-CM | POA: Insufficient documentation

## 2018-01-09 DIAGNOSIS — F17290 Nicotine dependence, other tobacco product, uncomplicated: Secondary | ICD-10-CM | POA: Diagnosis not present

## 2018-01-09 DIAGNOSIS — R079 Chest pain, unspecified: Secondary | ICD-10-CM | POA: Diagnosis present

## 2018-01-09 LAB — CBC
HCT: 38.7 % — ABNORMAL LOW (ref 39.0–52.0)
HEMOGLOBIN: 12.6 g/dL — AB (ref 13.0–17.0)
MCH: 28.6 pg (ref 26.0–34.0)
MCHC: 32.6 g/dL (ref 30.0–36.0)
MCV: 87.8 fL (ref 80.0–100.0)
Platelets: 357 10*3/uL (ref 150–400)
RBC: 4.41 MIL/uL (ref 4.22–5.81)
RDW: 13.3 % (ref 11.5–15.5)
WBC: 11.3 10*3/uL — AB (ref 4.0–10.5)
nRBC: 0 % (ref 0.0–0.2)

## 2018-01-09 LAB — BASIC METABOLIC PANEL
ANION GAP: 7 (ref 5–15)
BUN: 9 mg/dL (ref 6–20)
CHLORIDE: 105 mmol/L (ref 98–111)
CO2: 25 mmol/L (ref 22–32)
Calcium: 9 mg/dL (ref 8.9–10.3)
Creatinine, Ser: 0.97 mg/dL (ref 0.61–1.24)
GFR calc Af Amer: >60 mL/min (ref >60)
Glucose, Bld: 99 mg/dL (ref 70–99)
POTASSIUM: 3.9 mmol/L (ref 3.5–5.1)
Sodium: 137 mmol/L (ref 135–145)

## 2018-01-09 LAB — TROPONIN I

## 2018-01-09 MED ORDER — KETOROLAC TROMETHAMINE 15 MG/ML IJ SOLN
15.0000 mg | Freq: Once | INTRAMUSCULAR | Status: AC
  Administered 2018-01-09: 15 mg via INTRAVENOUS
  Filled 2018-01-09: qty 1
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
## 2018-01-09 NOTE — ED Triage Notes (Signed)
C/o right side CP x today-NAD-steady gait 

## 2018-01-09 NOTE — ED Notes (Signed)
Pt caused delay in care due to on phone/texting-pt had to be asked to stop all interruptions so EKG can be completed

## 2018-01-09 NOTE — Discharge Instructions (Addendum)
Take Tylenol or ibuprofen as needed for pain. You may use muscle creams or patches such as salonpas, icy hot, or BenGay to help with your pain. Make sure you are staying well-hydrated with water. You may contact the 1-866 number the back of the paperwork to help you set up a new primary care appointment.  Follow-up with primary care as needed if your symptoms are not improving. Return to the emergency room if you develop high fevers, severe worsening chest pain, difficulty breathing, or any new or concerning symptoms.

## 2018-04-04 ENCOUNTER — Other Ambulatory Visit: Payer: Self-pay

## 2018-04-04 ENCOUNTER — Encounter (HOSPITAL_BASED_OUTPATIENT_CLINIC_OR_DEPARTMENT_OTHER): Payer: Self-pay | Admitting: Emergency Medicine

## 2018-04-04 ENCOUNTER — Emergency Department (HOSPITAL_BASED_OUTPATIENT_CLINIC_OR_DEPARTMENT_OTHER)
Admission: EM | Admit: 2018-04-04 | Discharge: 2018-04-04 | Disposition: A | Discharge status: Home / Self Care | Payer: Medicaid Other | Attending: Emergency Medicine | Admitting: Emergency Medicine

## 2018-04-04 DIAGNOSIS — R300 Dysuria: Secondary | ICD-10-CM | POA: Insufficient documentation

## 2018-04-04 DIAGNOSIS — Z79899 Other long term (current) drug therapy: Secondary | ICD-10-CM | POA: Insufficient documentation

## 2018-04-04 DIAGNOSIS — F17290 Nicotine dependence, other tobacco product, uncomplicated: Secondary | ICD-10-CM | POA: Insufficient documentation

## 2018-04-04 DIAGNOSIS — I10 Essential (primary) hypertension: Secondary | ICD-10-CM | POA: Diagnosis not present

## 2018-04-04 DIAGNOSIS — R10819 Abdominal tenderness, unspecified site: Secondary | ICD-10-CM | POA: Insufficient documentation

## 2018-04-04 LAB — URINALYSIS, ROUTINE W REFLEX MICROSCOPIC
BILIRUBIN URINE: NEGATIVE
Glucose, UA: NEGATIVE mg/dL
HGB URINE DIPSTICK: NEGATIVE
KETONES UR: NEGATIVE mg/dL
Leukocytes, UA: NEGATIVE
NITRITE: NEGATIVE
PH: 7 (ref 5.0–8.0)
Protein, ur: NEGATIVE mg/dL
Specific Gravity, Urine: 1.01 (ref 1.005–1.030)

## 2018-04-04 NOTE — ED Triage Notes (Signed)
Patient had recent surgery and had a catheter. The patient reports that he is now having urinary burning

## 2018-04-04 NOTE — Discharge Instructions (Signed)
Please read and follow all provided instructions.  Your diagnoses today include:  1. Dysuria     Tests performed today include:  Vital signs. See below for your results today.   Urine test -no sign of infection  Medications prescribed:   None  Home care instructions:  Follow any educational materials contained in this packet.  Follow-up instructions: Please follow-up with your primary care provider as needed for further evaluation of your symptoms.  Return instructions:   Please return to the Emergency Department if you experience worsening symptoms.   Please return if you have any other emergent concerns.  Additional Information:  Your vital signs today were: BP 135/83 (BP Location: Right Arm)    Pulse 74    Temp 97.7 F (36.5 C) (Oral)    Resp 18    Ht 6\' 2"  (1.88 m)    Wt 116.6 kg    SpO2 99%    BMI 33.00 kg/m  If your blood pressure (BP) was elevated above 135/85 this visit, please have this repeated by your doctor within one month. ---------------
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
## 2018-09-30 ENCOUNTER — Emergency Department (HOSPITAL_BASED_OUTPATIENT_CLINIC_OR_DEPARTMENT_OTHER)
Admission: EM | Admit: 2018-09-30 | Discharge: 2018-10-01 | Disposition: A | Discharge status: Home / Self Care | Payer: Medicaid Other | Attending: Emergency Medicine | Admitting: Emergency Medicine

## 2018-09-30 DIAGNOSIS — Y998 Other external cause status: Secondary | ICD-10-CM | POA: Insufficient documentation

## 2018-09-30 DIAGNOSIS — S5012XA Contusion of left forearm, initial encounter: Secondary | ICD-10-CM | POA: Diagnosis not present

## 2018-09-30 DIAGNOSIS — S322XXA Fracture of coccyx, initial encounter for closed fracture: Secondary | ICD-10-CM | POA: Insufficient documentation

## 2018-09-30 DIAGNOSIS — Y9301 Activity, walking, marching and hiking: Secondary | ICD-10-CM | POA: Diagnosis not present

## 2018-09-30 DIAGNOSIS — Y929 Unspecified place or not applicable: Secondary | ICD-10-CM | POA: Diagnosis not present

## 2018-09-30 DIAGNOSIS — S3992XA Unspecified injury of lower back, initial encounter: Secondary | ICD-10-CM | POA: Diagnosis present

## 2018-09-30 DIAGNOSIS — I10 Essential (primary) hypertension: Secondary | ICD-10-CM | POA: Insufficient documentation

## 2018-09-30 DIAGNOSIS — W108XXA Fall (on) (from) other stairs and steps, initial encounter: Secondary | ICD-10-CM | POA: Diagnosis not present

## 2018-09-30 DIAGNOSIS — Z87891 Personal history of nicotine dependence: Secondary | ICD-10-CM | POA: Insufficient documentation

## 2018-10-01 ENCOUNTER — Encounter (HOSPITAL_BASED_OUTPATIENT_CLINIC_OR_DEPARTMENT_OTHER): Payer: Self-pay | Admitting: *Deleted

## 2018-10-01 ENCOUNTER — Emergency Department (HOSPITAL_BASED_OUTPATIENT_CLINIC_OR_DEPARTMENT_OTHER): Payer: Medicaid Other

## 2018-10-01 ENCOUNTER — Other Ambulatory Visit: Payer: Self-pay

## 2018-10-01 MED ORDER — IBUPROFEN 800 MG PO TABS
800.0000 mg | ORAL_TABLET | Freq: Once | ORAL | Status: AC
  Administered 2018-10-01: 800 mg via ORAL
  Filled 2018-10-01: qty 1

## 2018-10-01 MED ORDER — IBUPROFEN 800 MG PO TABS: 800.0000 mg | ORAL_TABLET | Freq: Three times a day (TID) | ORAL | 0 refills | Status: AC | PRN
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
## 2018-10-01 NOTE — ED Notes (Signed)
Patient transported to X-ray 

## 2018-10-01 NOTE — ED Triage Notes (Signed)
Pt c/o fall with left arm injury and left hip and back pain

## 2020-03-01 ENCOUNTER — Encounter (HOSPITAL_BASED_OUTPATIENT_CLINIC_OR_DEPARTMENT_OTHER): Payer: Self-pay

## 2020-03-01 ENCOUNTER — Other Ambulatory Visit (HOSPITAL_BASED_OUTPATIENT_CLINIC_OR_DEPARTMENT_OTHER): Payer: Self-pay | Admitting: Emergency Medicine

## 2020-03-01 ENCOUNTER — Emergency Department (HOSPITAL_BASED_OUTPATIENT_CLINIC_OR_DEPARTMENT_OTHER)
Admission: EM | Admit: 2020-03-01 | Discharge: 2020-03-01 | Disposition: A | Discharge status: Home / Self Care | Payer: Medicaid Other | Attending: Emergency Medicine | Admitting: Emergency Medicine

## 2020-03-01 ENCOUNTER — Other Ambulatory Visit: Payer: Self-pay

## 2020-03-01 DIAGNOSIS — L739 Follicular disorder, unspecified: Secondary | ICD-10-CM

## 2020-03-01 DIAGNOSIS — I10 Essential (primary) hypertension: Secondary | ICD-10-CM | POA: Diagnosis not present

## 2020-03-01 DIAGNOSIS — Z87891 Personal history of nicotine dependence: Secondary | ICD-10-CM | POA: Insufficient documentation

## 2020-03-01 DIAGNOSIS — L02214 Cutaneous abscess of groin: Secondary | ICD-10-CM | POA: Diagnosis present

## 2020-03-01 DIAGNOSIS — L662 Folliculitis decalvans: Secondary | ICD-10-CM | POA: Diagnosis not present

## 2020-03-01 MED ORDER — CLINDAMYCIN HCL 300 MG PO CAPS
300.0000 mg | ORAL_CAPSULE | Freq: Three times a day (TID) | ORAL | 0 refills | Status: DC
Start: 2020-03-01 — End: 2020-03-01

## 2020-03-01 MED ORDER — HYDROCODONE-ACETAMINOPHEN 5-325 MG PO TABS
1.0000 | ORAL_TABLET | Freq: Once | ORAL | Status: AC
  Administered 2020-03-01: 1 via ORAL
  Filled 2020-03-01: qty 1

## 2020-03-01 MED FILL — CLINDAMYCIN HCL 300 MG CAP: 300 | 10 days supply | Qty: 30 | Fill #0

## 2020-03-01 NOTE — Discharge Instructions (Signed)
Apply warm compresses to your left groin as often as you can.  Please return if symptoms worsen as discussed.

## 2020-03-01 NOTE — ED Triage Notes (Signed)
Pt c/o abscess/rash to left groin x 2 days-NAD-steady gait
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
## 2020-08-31 ENCOUNTER — Emergency Department (HOSPITAL_BASED_OUTPATIENT_CLINIC_OR_DEPARTMENT_OTHER)
Admission: EM | Admit: 2020-08-31 | Discharge: 2020-08-31 | Disposition: A | Discharge status: Home / Self Care | Payer: Medicaid Other | Attending: Emergency Medicine | Admitting: Emergency Medicine

## 2020-08-31 ENCOUNTER — Emergency Department (HOSPITAL_BASED_OUTPATIENT_CLINIC_OR_DEPARTMENT_OTHER): Payer: Medicaid Other

## 2020-08-31 ENCOUNTER — Other Ambulatory Visit: Payer: Self-pay

## 2020-08-31 ENCOUNTER — Encounter (HOSPITAL_BASED_OUTPATIENT_CLINIC_OR_DEPARTMENT_OTHER): Payer: Self-pay | Admitting: Emergency Medicine

## 2020-08-31 ENCOUNTER — Other Ambulatory Visit (HOSPITAL_BASED_OUTPATIENT_CLINIC_OR_DEPARTMENT_OTHER): Payer: Self-pay

## 2020-08-31 DIAGNOSIS — W010XXA Fall on same level from slipping, tripping and stumbling without subsequent striking against object, initial encounter: Secondary | ICD-10-CM | POA: Insufficient documentation

## 2020-08-31 DIAGNOSIS — I10 Essential (primary) hypertension: Secondary | ICD-10-CM | POA: Insufficient documentation

## 2020-08-31 DIAGNOSIS — S299XXA Unspecified injury of thorax, initial encounter: Secondary | ICD-10-CM | POA: Diagnosis present

## 2020-08-31 DIAGNOSIS — F17210 Nicotine dependence, cigarettes, uncomplicated: Secondary | ICD-10-CM | POA: Diagnosis not present

## 2020-08-31 DIAGNOSIS — Y9302 Activity, running: Secondary | ICD-10-CM | POA: Diagnosis not present

## 2020-08-31 DIAGNOSIS — S20212A Contusion of left front wall of thorax, initial encounter: Secondary | ICD-10-CM | POA: Diagnosis not present

## 2020-08-31 MED ORDER — IBUPROFEN 800 MG PO TABS
800.0000 mg | ORAL_TABLET | Freq: Once | ORAL | Status: AC
  Administered 2020-08-31: 800 mg via ORAL
  Filled 2020-08-31: qty 1

## 2020-08-31 MED ORDER — HYDROCODONE-ACETAMINOPHEN 5-325 MG PO TABS
1.0000 | ORAL_TABLET | Freq: Four times a day (QID) | ORAL | 0 refills | Status: AC | PRN
  Filled 2020-08-31: qty 6, 2d supply, fill #0

## 2020-08-31 MED ORDER — ACETAMINOPHEN 500 MG PO TABS
1000.0000 mg | ORAL_TABLET | Freq: Once | ORAL | Status: AC
  Administered 2020-08-31: 1000 mg via ORAL
  Filled 2020-08-31: qty 2

## 2020-08-31 MED ORDER — GABAPENTIN 300 MG PO CAPS
300.0000 mg | ORAL_CAPSULE | Freq: Three times a day (TID) | ORAL | 0 refills | Status: AC
  Filled 2020-08-31: qty 21, 7d supply, fill #0

## 2020-08-31 NOTE — ED Triage Notes (Signed)
Was chasing a dog and tripped and fell  on left side on side walk , last night , did not bump head but it did knock the breath out of him,  hurts back and left rib area , tender to touch

## 2020-08-31 NOTE — Discharge Instructions (Signed)
Please read and follow all provided instructions.  Your diagnoses today include:  1. Contusion of rib on left side, initial encounter     Tests performed today include: X-ray of your chest - no signs of obviously broken bone, does not rule out minor fractures. Vital signs. See below for your results today.   Medications prescribed:  Vicodin (hydrocodone/acetaminophen) - narcotic pain medication  DO NOT drive or perform any activities that require you to be awake and alert because this medicine can make you drowsy. BE VERY CAREFUL not to take multiple medicines containing Tylenol (also called acetaminophen). Doing so can lead to an overdose which can damage your liver and cause liver failure and possibly death.  Take any prescribed medications only as directed.  Home care instructions:  Follow any educational materials contained in this packet.  BE VERY CAREFUL not to take multiple medicines containing Tylenol (also called acetaminophen). Doing so can lead to an overdose which can damage your liver and cause liver failure and possibly death.   Follow-up instructions: Please follow-up with your primary care provider in the next 3 days for further evaluation of your symptoms.   Return instructions:  Please return to the Emergency Department if you experience worsening symptoms.  Please return if you have worsening abdominal pain, lightheadedness or you pass out. Please return if you have any other emergent concerns.  Additional Information:  Your vital signs today were: BP 123/62 (BP Location: Right Arm)   Pulse (!) 50   Temp 98.3 F (36.8 C) (Oral)   Resp 16   Ht 6\' 1"  (1.854 m)   Wt 126.1 kg   SpO2 99%   BMI 36.68 kg/m  If your blood pressure (BP) was elevated above 135/85 this visit, please have this repeated by your doctor within one month. --------------
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
## 2021-03-21 ENCOUNTER — Encounter (HOSPITAL_BASED_OUTPATIENT_CLINIC_OR_DEPARTMENT_OTHER): Payer: Self-pay | Admitting: *Deleted

## 2021-03-21 ENCOUNTER — Emergency Department (HOSPITAL_BASED_OUTPATIENT_CLINIC_OR_DEPARTMENT_OTHER)
Admission: EM | Admit: 2021-03-21 | Discharge: 2021-03-22 | Disposition: A | Discharge status: Home / Self Care | Payer: Medicaid Other | Attending: Emergency Medicine | Admitting: Emergency Medicine

## 2021-03-21 ENCOUNTER — Other Ambulatory Visit: Payer: Self-pay

## 2021-03-21 DIAGNOSIS — L02214 Cutaneous abscess of groin: Secondary | ICD-10-CM | POA: Insufficient documentation

## 2021-03-21 DIAGNOSIS — L0291 Cutaneous abscess, unspecified: Secondary | ICD-10-CM

## 2021-03-21 MED ORDER — LIDOCAINE-EPINEPHRINE (PF) 2 %-1:200000 IJ SOLN
10.0000 mL | Freq: Once | INTRAMUSCULAR | Status: AC
  Administered 2021-03-21: 10 mL via INTRADERMAL

## 2021-03-21 MED ORDER — ACETAMINOPHEN 325 MG PO TABS
650.0000 mg | ORAL_TABLET | Freq: Once | ORAL | Status: AC
  Administered 2021-03-22: 650 mg via ORAL

## 2021-03-21 MED ORDER — AMOXICILLIN-POT CLAVULANATE 875-125 MG PO TABS
1.0000 | ORAL_TABLET | Freq: Once | ORAL | Status: AC
  Administered 2021-03-22: 1 via ORAL
  Filled 2021-03-21: qty 1

## 2021-03-21 MED ORDER — LIDOCAINE-EPINEPHRINE (PF) 2 %-1:200000 IJ SOLN
INTRAMUSCULAR | Status: AC
  Filled 2021-03-21: qty 20

## 2021-03-21 MED ORDER — AMOXICILLIN-POT CLAVULANATE 875-125 MG PO TABS: 1.0000 | ORAL_TABLET | Freq: Two times a day (BID) | ORAL | 0 refills | Status: DC

## 2021-03-21 MED ORDER — ACETAMINOPHEN 325 MG PO TABS
650.0000 mg | ORAL_TABLET | Freq: Once | ORAL | Status: DC
  Filled 2021-03-21: qty 2

## 2021-03-21 MED ORDER — CHLORHEXIDINE GLUCONATE 4 % EX LIQD: Freq: Every day | CUTANEOUS | 0 refills | Status: AC | PRN

## 2021-03-21 NOTE — ED Triage Notes (Signed)
C/o 2 abscess to left thigh x 1 day
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
## 2021-03-21 NOTE — Discharge Instructions (Signed)
You are seen in the ER today for the swelling and pain in your left groin.  This was diagnosed as an abscess and was drained in the emergency department.  This abscess occurred because of skin infection likely from an infected hair follicle.  You have been prescribed an antibiotic to take twice a day for the next week.  Please take this as prescribed for the entire course.  Apply warm compresses with hot towel to the area a few times a day to encourage drainage of the pus from the area.  He may use Tylenol or ibuprofen as needed for your discomfort.  Additionally you have been prescribed a body wash to use just in this area to prevent recurrent skin infections in the future.  Return to the ER if you develop any new or increased swelling to the area, fevers, chills, nausea, vomiting, pain in your scrotum or penis itself, or any other new severe symptom.

## 2021-03-21 NOTE — ED Notes (Signed)
ED Provider at bedside. 

## 2021-04-15 ENCOUNTER — Other Ambulatory Visit: Payer: Self-pay

## 2021-04-15 ENCOUNTER — Emergency Department (HOSPITAL_BASED_OUTPATIENT_CLINIC_OR_DEPARTMENT_OTHER)
Admission: EM | Admit: 2021-04-15 | Discharge: 2021-04-16 | Disposition: A | Discharge status: Home / Self Care | Payer: Medicaid Other | Attending: Emergency Medicine | Admitting: Emergency Medicine

## 2021-04-15 DIAGNOSIS — A6001 Herpesviral infection of penis: Secondary | ICD-10-CM

## 2021-04-15 DIAGNOSIS — B009 Herpesviral infection, unspecified: Secondary | ICD-10-CM | POA: Diagnosis not present

## 2021-04-15 DIAGNOSIS — N4889 Other specified disorders of penis: Secondary | ICD-10-CM | POA: Diagnosis present

## 2021-04-15 DIAGNOSIS — Z202 Contact with and (suspected) exposure to infections with a predominantly sexual mode of transmission: Secondary | ICD-10-CM | POA: Diagnosis not present

## 2021-04-16 ENCOUNTER — Encounter (HOSPITAL_BASED_OUTPATIENT_CLINIC_OR_DEPARTMENT_OTHER): Payer: Self-pay | Admitting: Emergency Medicine

## 2021-04-16 LAB — HIV ANTIBODY (ROUTINE TESTING W REFLEX): HIV Screen 4th Generation wRfx: NONREACTIVE

## 2021-04-16 MED ORDER — VALACYCLOVIR HCL 500 MG PO TABS
1000.0000 mg | ORAL_TABLET | ORAL | Status: AC
  Administered 2021-04-16: 1000 mg via ORAL
  Filled 2021-04-16: qty 2

## 2021-04-16 MED ORDER — VALACYCLOVIR HCL 1 G PO TABS: 1000.0000 mg | ORAL_TABLET | Freq: Three times a day (TID) | ORAL | 0 refills | Status: AC

## 2021-04-16 NOTE — ED Triage Notes (Signed)
Pt POV from home for possible exposure to STD. States his partner tested + for HSV2 on Thursday and he would like to be tested. Pt denies urinary sx and penile discharge.

## 2021-04-16 NOTE — ED Provider Notes (Signed)
[facility] EMERGENCY DEPARTMENT Provider Note   CSN: [id number] Arrival date & time: 04/15/21  2352     History  Chief Complaint  Patient presents with   Exposure to STD    Sean Castillo is a 36 y.o. male.  Patient presents to the emergency department with concerns over possible new herpes outbreak.  Patient reports that he noticed a painful lesion on his penis in the last 24 hours.  He recently had a work-up at [facility] where he had GC and Chlamydia testing that was negative but his wife tested positive for HSV-2.      Home Medications Prior to Admission medications   Medication Sig Start Date End Date Taking? Authorizing Provider  valACYclovir (VALTREX) 1000 MG tablet Take 1 tablet (1,000 mg total) by mouth 3 (three) times daily. 04/16/21  Yes Leshon Armistead, Canary Brim, MD  amoxicillin-clavulanate (AUGMENTIN) 875-125 MG tablet Take 1 tablet by mouth every 12 (twelve) hours. 03/21/21   Sponseller, Lupe Carney R, PA-C  benzonatate (TESSALON) 100 MG capsule Take 1 capsule (100 mg total) by mouth every 8 (eight) hours. 07/20/17   Maxwell Caul, PA-C  chlorhexidine (HIBICLENS) 4 % external liquid Apply topically daily as needed. 03/21/21   Sponseller, Lupe Carney R, PA-C  gabapentin (NEURONTIN) 300 MG capsule Take 1 capsule (300 mg total) by mouth 3 (three) times daily for 7 days. 08/31/20 09/07/20  Renne Crigler, PA-C  HYDROcodone-acetaminophen (NORCO/VICODIN) 5-325 MG tablet Take 1 tablet by mouth every 6 (six) hours as needed for severe pain. 08/31/20   Renne Crigler, PA-C  ibuprofen (ADVIL) 800 MG tablet Take 1 tablet (800 mg total) by mouth every 8 (eight) hours as needed (for pain). 10/01/18   Molpus, John, MD  mometasone (NASONEX) 50 MCG/ACT nasal spray Place 2 sprays into the nose daily. 03/18/17   Cartner, Sharlet Salina, PA-C  polyethylene glycol (MIRALAX / GLYCOLAX) packet Take by mouth. 03/24/18   [provider]  senna-docusate (SENOKOT-S) 8.6-50 MG tablet Take by mouth. 03/24/18    [provider]      Allergies    Aspirin    Review of Systems   Review of Systems  Genitourinary:  Negative for penile discharge.  Skin:  Positive for wound.   Physical Exam Updated Vital Signs BP (!) 146/95    Pulse 89    Temp 97.7 F (36.5 C) (Oral)    SpO2 98%  Physical Exam Vitals and nursing note reviewed. Exam conducted with a chaperone present.  Constitutional:      Appearance: Normal appearance.  HENT:     Head: Atraumatic.  Pulmonary:     Effort: Pulmonary effort is normal.  Musculoskeletal:        General: Normal range of motion.  Skin:    Comments: 3 mm ulcerated area left side of penis just proximal to glans  Neurological:     General: No focal deficit present.     Mental Status: He is alert.    ED Results / Procedures / Treatments   Labs (all labs ordered are listed, but only abnormal results are displayed) Labs Reviewed  RPR  HERPES SIMPLEX VIRUS(HSV) DNA BY PCR  HIV ANTIBODY (ROUTINE TESTING W REFLEX)    EKG None  Radiology No results found.  Procedures Procedures    Medications Ordered in ED Medications  valACYclovir (VALTREX) tablet 1,000 mg (has no administration in time range)    ED Course/ Medical Decision Making/ A&P  Medical Decision Making Amount and/or Complexity of Data Reviewed Labs: ordered.  Risk Prescription drug management.   Patient presents to the emergency department with a penile lesion.  Lesion is suspicious for HSV.  Patient has a positive HSV contact.  Testing has been sent, initiate empiric treatment.  He was recently tested for [facility] and chlamydia, no urethral discharge.  No GC and Chlamydia testing required but he has not been tested for syphilis and HIV, this was sent.        Final Clinical Impression(s) / ED Diagnoses Final diagnoses:  Herpes simplex infection of penis    Rx / DC Orders ED Discharge Orders          Ordered    valACYclovir (VALTREX) 1000 MG  tablet  3 times daily        04/16/21 0031              Gilda Crease, MD 04/16/21 [phone]

## 2021-04-17 LAB — RPR: RPR Ser Ql: NONREACTIVE

## 2021-04-19 LAB — HSV DNA BY PCR (REFERENCE LAB)
HSV 1 DNA: NEGATIVE
HSV 2 DNA: NEGATIVE

## 2021-04-28 ENCOUNTER — Encounter (HOSPITAL_BASED_OUTPATIENT_CLINIC_OR_DEPARTMENT_OTHER): Payer: Self-pay | Admitting: *Deleted

## 2021-04-28 ENCOUNTER — Other Ambulatory Visit: Payer: Self-pay

## 2021-04-28 ENCOUNTER — Emergency Department (HOSPITAL_BASED_OUTPATIENT_CLINIC_OR_DEPARTMENT_OTHER): Payer: Medicaid Other

## 2021-04-28 ENCOUNTER — Emergency Department (HOSPITAL_BASED_OUTPATIENT_CLINIC_OR_DEPARTMENT_OTHER)
Admission: EM | Admit: 2021-04-28 | Discharge: 2021-04-28 | Disposition: A | Discharge status: Home / Self Care | Payer: Medicaid Other | Attending: Emergency Medicine | Admitting: Emergency Medicine

## 2021-04-28 DIAGNOSIS — S6991XD Unspecified injury of right wrist, hand and finger(s), subsequent encounter: Secondary | ICD-10-CM | POA: Diagnosis present

## 2021-04-28 DIAGNOSIS — Y990 Civilian activity done for income or pay: Secondary | ICD-10-CM | POA: Diagnosis not present

## 2021-04-28 DIAGNOSIS — M25531 Pain in right wrist: Secondary | ICD-10-CM | POA: Insufficient documentation

## 2021-04-28 DIAGNOSIS — S63501D Unspecified sprain of right wrist, subsequent encounter: Secondary | ICD-10-CM | POA: Diagnosis not present

## 2021-04-28 DIAGNOSIS — X501XXD Overexertion from prolonged static or awkward postures, subsequent encounter: Secondary | ICD-10-CM | POA: Diagnosis not present

## 2021-04-28 NOTE — Discharge Instructions (Addendum)
You have been provided with the information of Dr.[facility].  Please call this office to schedule an appointment for further evaluation of your right wrist.  You have also been provided a wrist splint today.  You may wear this with activity or as needed.    You may manage pain with over-the-counter ibuprofen or Tylenol as needed.
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
## 2021-04-28 NOTE — ED Provider Notes (Signed)
[facility] EMERGENCY DEPARTMENT Provider Note   CSN: [id number] Arrival date & time: 04/28/21  1521     History  Chief Complaint  Patient presents with   Wrist Pain    Sean Castillo is a 36 y.o. male presenting today with continued right wrist pain after an injury he sustained at work on March 28, 2021.  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  He was moving an object out of the way when his wrist rotated inwards and he felt pain.  He was seen at urgent care who performed x-rays that he said resulted with no fracture.  He was provided a splint and was told urgent care would set him up with physical therapy.  He stated he not received a call from physical therapy and has been waiting.  Patient describes continued pain that is worse at night and after overuse.  He is not wearing a splint today.  He inquires about being set up with physical therapy.  The history is provided by the patient.  Wrist Pain      Home Medications Prior to Admission medications   Medication Sig Start Date End Date Taking? Authorizing Provider  amoxicillin-clavulanate (AUGMENTIN) 875-125 MG tablet Take 1 tablet by mouth every 12 (twelve) hours. 03/21/21   Sponseller, Lupe Carney R, PA-C  benzonatate (TESSALON) 100 MG capsule Take 1 capsule (100 mg total) by mouth every 8 (eight) hours. 07/20/17   Maxwell Caul, PA-C  chlorhexidine (HIBICLENS) 4 % external liquid Apply topically daily as needed. 03/21/21   Sponseller, Lupe Carney R, PA-C  gabapentin (NEURONTIN) 300 MG capsule Take 1 capsule (300 mg total) by mouth 3 (three) times daily for 7 days. 08/31/20 09/07/20  Renne Crigler, PA-C  HYDROcodone-acetaminophen (NORCO/VICODIN) 5-325 MG tablet Take 1 tablet by mouth every 6 (six) hours as needed for severe pain. 08/31/20   Renne Crigler, PA-C  ibuprofen (ADVIL) 800 MG tablet Take 1 tablet (800 mg total) by mouth every 8 (eight) hours as needed (for pain). 10/01/18   Molpus, John, MD  mometasone  (NASONEX) 50 MCG/ACT nasal spray Place 2 sprays into the nose daily. 03/18/17   Cartner, Sharlet Salina, PA-C  polyethylene glycol (MIRALAX / GLYCOLAX) packet Take by mouth. 03/24/18   [provider]  senna-docusate (SENOKOT-S) 8.6-50 MG tablet Take by mouth. 03/24/18   [provider]  valACYclovir (VALTREX) 1000 MG tablet Take 1 tablet (1,000 mg total) by mouth 3 (three) times daily. 04/16/21   Gilda Crease, MD      Allergies    Aspirin    Review of Systems   Review of Systems  Musculoskeletal:  Positive for arthralgias (Right wrist). Negative for joint swelling (Right wrist).  Neurological:        Random episodes of numbness and tingling   Physical Exam Updated Vital Signs BP 131/85 (BP Location: Left Arm)    Pulse 85    Temp 98.1 F (36.7 C) (Oral)    Resp 16    Ht 6\' 1"  (1.854 m)    Wt 133.8 kg    SpO2 98%    BMI 38.92 kg/m  Physical Exam Vitals and nursing note reviewed.  Constitutional:      General: He is not in acute distress.    Appearance: He is well-developed.  HENT:     Head: Normocephalic and atraumatic.  Eyes:     Conjunctiva/sclera: Conjunctivae normal.  Cardiovascular:     Rate and Rhythm: Normal rate and regular rhythm.  Pulses:          Radial pulses are 2+ on the right side and 2+ on the left side.     Heart sounds: No murmur heard. Pulmonary:     Effort: Pulmonary effort is normal. No respiratory distress.  Abdominal:     Palpations: Abdomen is soft.     Tenderness: There is no abdominal tenderness.  Musculoskeletal:        General: No swelling.     Right wrist: Tenderness present. No swelling, deformity, effusion, lacerations, bony tenderness, snuff box tenderness or crepitus. Normal range of motion. Normal pulse.     Left wrist: Normal. No deformity. Normal pulse.     Cervical back: Neck supple.     Comments:  Positive Tinel's test elicited of the right wrist Mild tenderness noted near the ulnar styloid and surrounding soft  tissue Full active and passive range of motion of the right wrist, right elbow, and all digits of the right hand with mild stiffness noted of the right wrist.  Skin:    General: Skin is warm and dry.     Capillary Refill: Capillary refill takes less than 2 seconds.  Neurological:     Mental Status: He is alert.  Psychiatric:        Mood and Affect: Mood normal.    ED Results / Procedures / Treatments   Labs (all labs ordered are listed, but only abnormal results are displayed) Labs Reviewed - No data to display  EKG None  Radiology DG Wrist Complete Right  Result Date: 04/28/2021 CLINICAL DATA:  A 36 year old male presents following wrist injury in January with worsening pain. EXAM: RIGHT WRIST - COMPLETE 3+ VIEW COMPARISON:  None FINDINGS: There is no evidence of fracture or dislocation. There is no evidence of arthropathy or other focal bone abnormality. Soft tissues are unremarkable. IMPRESSION: Negative. Electronically Signed   By: Zetta Bills M.D.   On: 04/28/2021 16:37    Procedures Procedures    Medications Ordered in ED Medications - No data to display  ED Course/ Medical Decision Making/ A&P                           Medical Decision Making Amount and/or Complexity of Data Reviewed Radiology: ordered.   Patient X-Ray negative for obvious fracture or dislocation. Pain managed in ED.  Pt advised to follow up with orthopedics for possibility of missed fracture diagnosis and to consider carpal tunnel syndrome.  Encouraged to utilize over-the-counter Tylenol or ibuprofen for outpatient pain management.  Patient given brace while in ED, conservative therapy recommended and discussed.  Patient will be dc home & is agreeable with above plan.         Final Clinical Impression(s) / ED Diagnoses Final diagnoses:  Sprain of right wrist, subsequent encounter    Rx / DC Orders ED Discharge Orders     None         Candace Cruise [id number]     Blanchie Dessert, MD 04/28/21 1738

## 2021-04-28 NOTE — ED Notes (Signed)
Pt in bed, pt c/o R wrist pain, pt has strong radial pulse, no swelling or deformity noted.

## 2021-05-06 ENCOUNTER — Emergency Department (HOSPITAL_BASED_OUTPATIENT_CLINIC_OR_DEPARTMENT_OTHER)
Admission: EM | Admit: 2021-05-06 | Discharge: 2021-05-06 | Disposition: A | Discharge status: Home / Self Care | Payer: Medicaid Other | Attending: Emergency Medicine | Admitting: Emergency Medicine

## 2021-05-06 ENCOUNTER — Encounter (HOSPITAL_BASED_OUTPATIENT_CLINIC_OR_DEPARTMENT_OTHER): Payer: Self-pay | Admitting: Urology

## 2021-05-06 ENCOUNTER — Other Ambulatory Visit: Payer: Self-pay

## 2021-05-06 DIAGNOSIS — I10 Essential (primary) hypertension: Secondary | ICD-10-CM | POA: Diagnosis not present

## 2021-05-06 DIAGNOSIS — Z202 Contact with and (suspected) exposure to infections with a predominantly sexual mode of transmission: Secondary | ICD-10-CM | POA: Diagnosis present

## 2021-05-06 LAB — URINALYSIS, ROUTINE W REFLEX MICROSCOPIC
Bilirubin Urine: NEGATIVE
Glucose, UA: NEGATIVE mg/dL
Ketones, ur: NEGATIVE mg/dL
Leukocytes,Ua: NEGATIVE
Nitrite: NEGATIVE
Protein, ur: NEGATIVE mg/dL
Specific Gravity, Urine: 1.02 (ref 1.005–1.030)
pH: 5.5 (ref 5.0–8.0)

## 2021-05-06 LAB — URINALYSIS, MICROSCOPIC (REFLEX)

## 2021-05-06 MED ORDER — METRONIDAZOLE 500 MG PO TABS
2000.0000 mg | ORAL_TABLET | Freq: Once | ORAL | Status: AC
  Administered 2021-05-06: 2000 mg via ORAL
  Filled 2021-05-06: qty 4

## 2021-05-06 NOTE — ED Triage Notes (Signed)
State exposure to [employer] for STD treatment No symptoms

## 2021-05-06 NOTE — ED Provider Notes (Signed)
[facility] EMERGENCY DEPARTMENT Provider Note   CSN: [id number] Arrival date & time: 05/06/21  2240     History  Chief Complaint  Patient presents with   Exposure to STD    Sean Castillo is a 36 y.o. male.   Exposure to STD   Patient with no contributable medical history presenting today due to STD exposure.  His girlfriend tested positive for trichomoniasis, patient is asymptomatic but desires treatment.  No penile discharge, testicular swelling, abdominal pain, fevers.  Home Medications Prior to Admission medications   Medication Sig Start Date End Date Taking? Authorizing Provider  amoxicillin-clavulanate (AUGMENTIN) 875-125 MG tablet Take 1 tablet by mouth every 12 (twelve) hours. 03/21/21   Sponseller, Lupe Carney R, PA-C  benzonatate (TESSALON) 100 MG capsule Take 1 capsule (100 mg total) by mouth every 8 (eight) hours. 07/20/17   Maxwell Caul, PA-C  chlorhexidine (HIBICLENS) 4 % external liquid Apply topically daily as needed. 03/21/21   Sponseller, Lupe Carney R, PA-C  gabapentin (NEURONTIN) 300 MG capsule Take 1 capsule (300 mg total) by mouth 3 (three) times daily for 7 days. 08/31/20 09/07/20  Renne Crigler, PA-C  HYDROcodone-acetaminophen (NORCO/VICODIN) 5-325 MG tablet Take 1 tablet by mouth every 6 (six) hours as needed for severe pain. 08/31/20   Renne Crigler, PA-C  ibuprofen (ADVIL) 800 MG tablet Take 1 tablet (800 mg total) by mouth every 8 (eight) hours as needed (for pain). 10/01/18   Molpus, John, MD  mometasone (NASONEX) 50 MCG/ACT nasal spray Place 2 sprays into the nose daily. 03/18/17   Cartner, Sharlet Salina, PA-C  polyethylene glycol (MIRALAX / GLYCOLAX) packet Take by mouth. 03/24/18   [provider]  senna-docusate (SENOKOT-S) 8.6-50 MG tablet Take by mouth. 03/24/18   [provider]  valACYclovir (VALTREX) 1000 MG tablet Take 1 tablet (1,000 mg total) by mouth 3 (three) times daily. 04/16/21   Gilda Crease, MD      Allergies     Aspirin    Review of Systems   Review of Systems  Physical Exam Updated Vital Signs BP (!) 141/95 (BP Location: Left Arm)    Pulse 68    Temp 98 F (36.7 C) (Oral)    Resp 18    Ht 6\' 1"  (1.854 m)    Wt 133.8 kg    SpO2 99%    BMI 38.92 kg/m  Physical Exam Vitals and nursing note reviewed. Exam conducted with a chaperone present.  Constitutional:      General: He is not in acute distress.    Appearance: Normal appearance.  HENT:     Head: Normocephalic and atraumatic.  Eyes:     General: No scleral icterus.    Extraocular Movements: Extraocular movements intact.     Pupils: Pupils are equal, round, and reactive to light.  Abdominal:     General: Abdomen is flat.     Tenderness: There is no abdominal tenderness.  Skin:    Coloration: Skin is not jaundiced.  Neurological:     Mental Status: He is alert. Mental status is at baseline.     Coordination: Coordination normal.    ED Results / Procedures / Treatments   Labs (all labs ordered are listed, but only abnormal results are displayed) Labs Reviewed  URINALYSIS, ROUTINE W REFLEX MICROSCOPIC  GC/CHLAMYDIA PROBE AMP ([place]) NOT AT [facility]    EKG None  Radiology No results found.  Procedures Procedures    Medications Ordered in ED Medications  metroNIDAZOLE (FLAGYL) tablet 2,000  mg (has no administration in time range)    ED Course/ Medical Decision Making/ A&P                           Medical Decision Making Amount and/or Complexity of Data Reviewed Labs: ordered.  Risk Prescription drug management.   Patient is mildly hypertensive, vitals unremarkable.  Physical exam benign.  Differential could include PID, other GC given patient is asymptomatic but has positive exposure will treat empirically.  We will also test for gonorrhea and chlamydia.  Do not feel he needs additional work-up or evaluation at this time.   Ordered 2 g of Flagyl, discharged in stable condition.        Final Clinical  Impression(s) / ED Diagnoses Final diagnoses:  STD exposure    Rx / DC Orders ED Discharge Orders     None         Theron Arista, Cordelia Poche 05/06/21 2255    Charlynne Pander, MD 05/07/21 [phone]

## 2021-05-06 NOTE — Discharge Instructions (Signed)
He was treated for trichomoniasis in the ED.  Check MyChart for the results of the gonorrhea chlamydia test.  Avoid sex for the next 2 weeks after taking the medicine, get rechecked at the health clinic or your primary care office.  Information above if you do not have a PCP.

## 2021-05-08 LAB — GC/CHLAMYDIA PROBE AMP (~~LOC~~) NOT AT ARMC
Chlamydia: NEGATIVE
Comment: NEGATIVE
Comment: NORMAL
Neisseria Gonorrhea: NEGATIVE

## 2021-06-25 ENCOUNTER — Other Ambulatory Visit: Payer: Self-pay

## 2021-06-25 ENCOUNTER — Encounter (HOSPITAL_BASED_OUTPATIENT_CLINIC_OR_DEPARTMENT_OTHER): Payer: Self-pay | Admitting: Emergency Medicine

## 2021-06-25 ENCOUNTER — Emergency Department (HOSPITAL_BASED_OUTPATIENT_CLINIC_OR_DEPARTMENT_OTHER)
Admission: EM | Admit: 2021-06-25 | Discharge: 2021-06-25 | Disposition: A | Discharge status: Home / Self Care | Payer: Medicaid Other | Attending: Emergency Medicine | Admitting: Emergency Medicine

## 2021-06-25 ENCOUNTER — Emergency Department (HOSPITAL_BASED_OUTPATIENT_CLINIC_OR_DEPARTMENT_OTHER): Payer: Medicaid Other

## 2021-06-25 DIAGNOSIS — H66002 Acute suppurative otitis media without spontaneous rupture of ear drum, left ear: Secondary | ICD-10-CM | POA: Insufficient documentation

## 2021-06-25 DIAGNOSIS — R059 Cough, unspecified: Secondary | ICD-10-CM | POA: Diagnosis present

## 2021-06-25 DIAGNOSIS — I10 Essential (primary) hypertension: Secondary | ICD-10-CM | POA: Diagnosis not present

## 2021-06-25 DIAGNOSIS — Z20822 Contact with and (suspected) exposure to covid-19: Secondary | ICD-10-CM | POA: Insufficient documentation

## 2021-06-25 DIAGNOSIS — J45909 Unspecified asthma, uncomplicated: Secondary | ICD-10-CM | POA: Insufficient documentation

## 2021-06-25 DIAGNOSIS — J069 Acute upper respiratory infection, unspecified: Secondary | ICD-10-CM | POA: Insufficient documentation

## 2021-06-25 LAB — RESP PANEL BY RT-PCR (FLU A&B, COVID) ARPGX2
Influenza A by PCR: NEGATIVE
Influenza B by PCR: NEGATIVE
SARS Coronavirus 2 by RT PCR: NEGATIVE

## 2021-06-25 LAB — GROUP A STREP BY PCR: Group A Strep by PCR: NOT DETECTED

## 2021-06-25 MED ORDER — BENZONATATE 100 MG PO CAPS: 100.0000 mg | ORAL_CAPSULE | Freq: Three times a day (TID) | ORAL | 0 refills | Status: AC

## 2021-06-25 MED ORDER — ALBUTEROL SULFATE HFA 108 (90 BASE) MCG/ACT IN AERS
2.0000 | INHALATION_SPRAY | Freq: Once | RESPIRATORY_TRACT | Status: AC
  Administered 2021-06-25: 2 via RESPIRATORY_TRACT
  Filled 2021-06-25: qty 6.7

## 2021-06-25 MED ORDER — DEXAMETHASONE 4 MG PO TABS
10.0000 mg | ORAL_TABLET | Freq: Once | ORAL | Status: AC
  Administered 2021-06-25: 10 mg via ORAL

## 2021-06-25 MED ORDER — AMOXICILLIN-POT CLAVULANATE 875-125 MG PO TABS: 1.0000 | ORAL_TABLET | Freq: Two times a day (BID) | ORAL | 0 refills | Status: AC
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
## 2021-06-25 NOTE — ED Triage Notes (Signed)
Productive cough x 4 days , left earache , sore throat .  ?

## 2021-08-31 IMAGING — CR DG RIBS W/ CHEST 3+V*L*
3 series · 3 of 3 positions shown · non-contrast
Comparison: January 09, 2018

CLINICAL DATA: Pain following fall

EXAM:
LEFT RIBS AND CHEST - 3+ VIEW

[w chest pa]
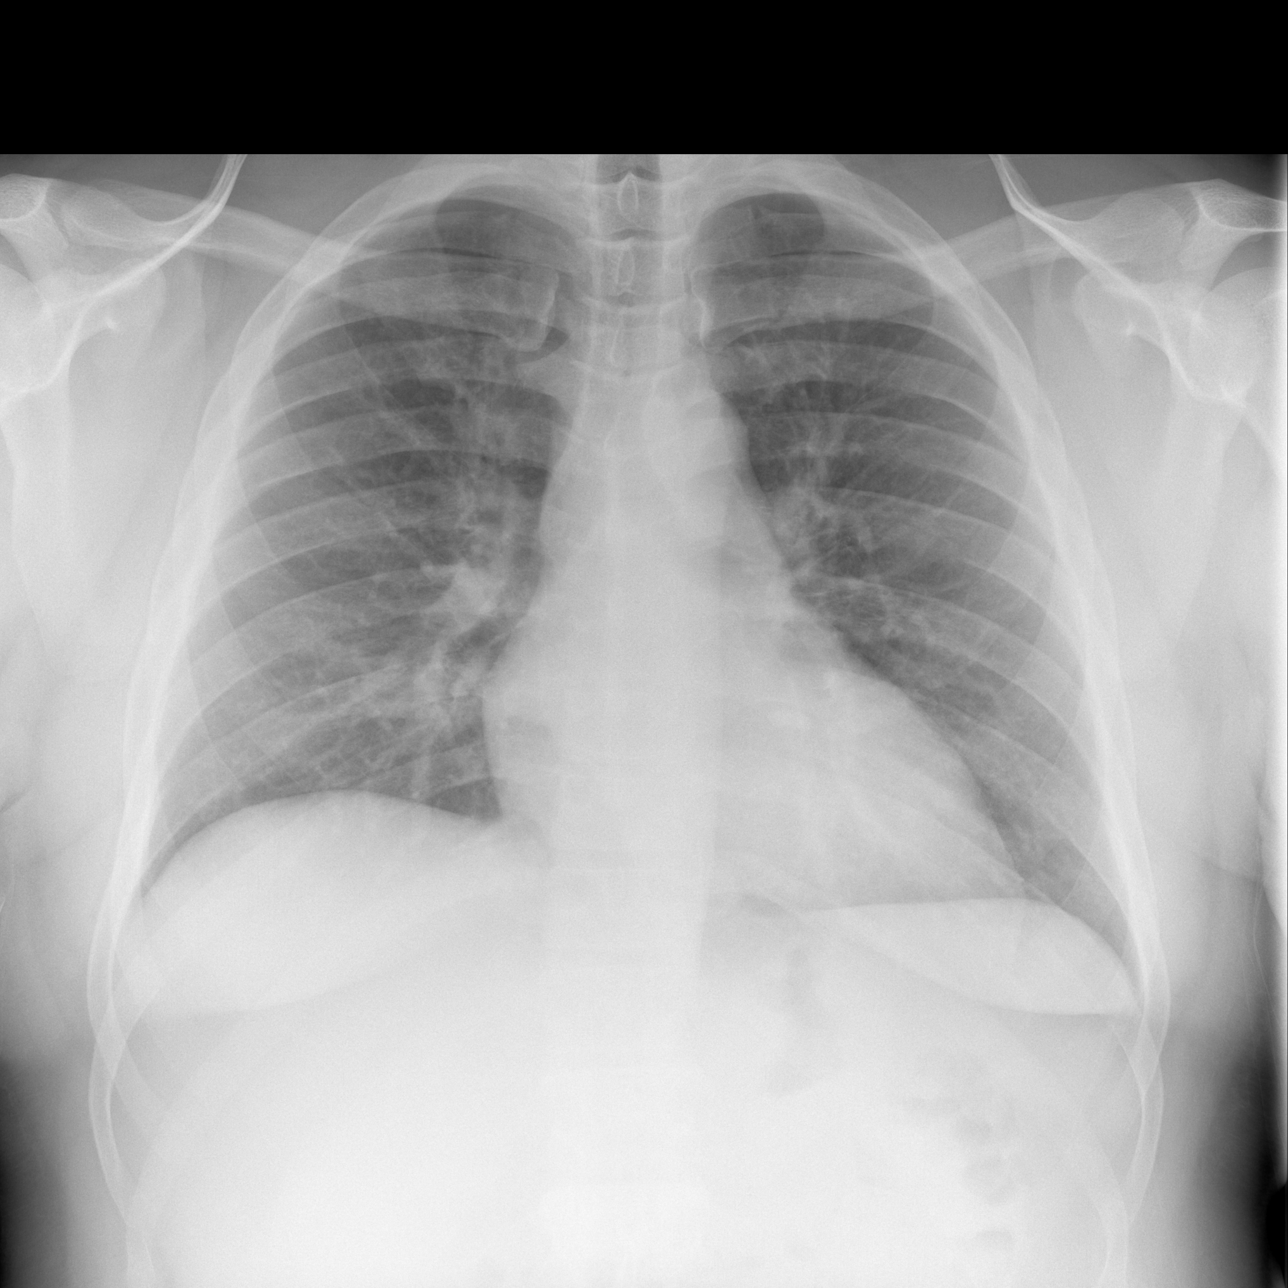

[w ribs ap/pa upper left]
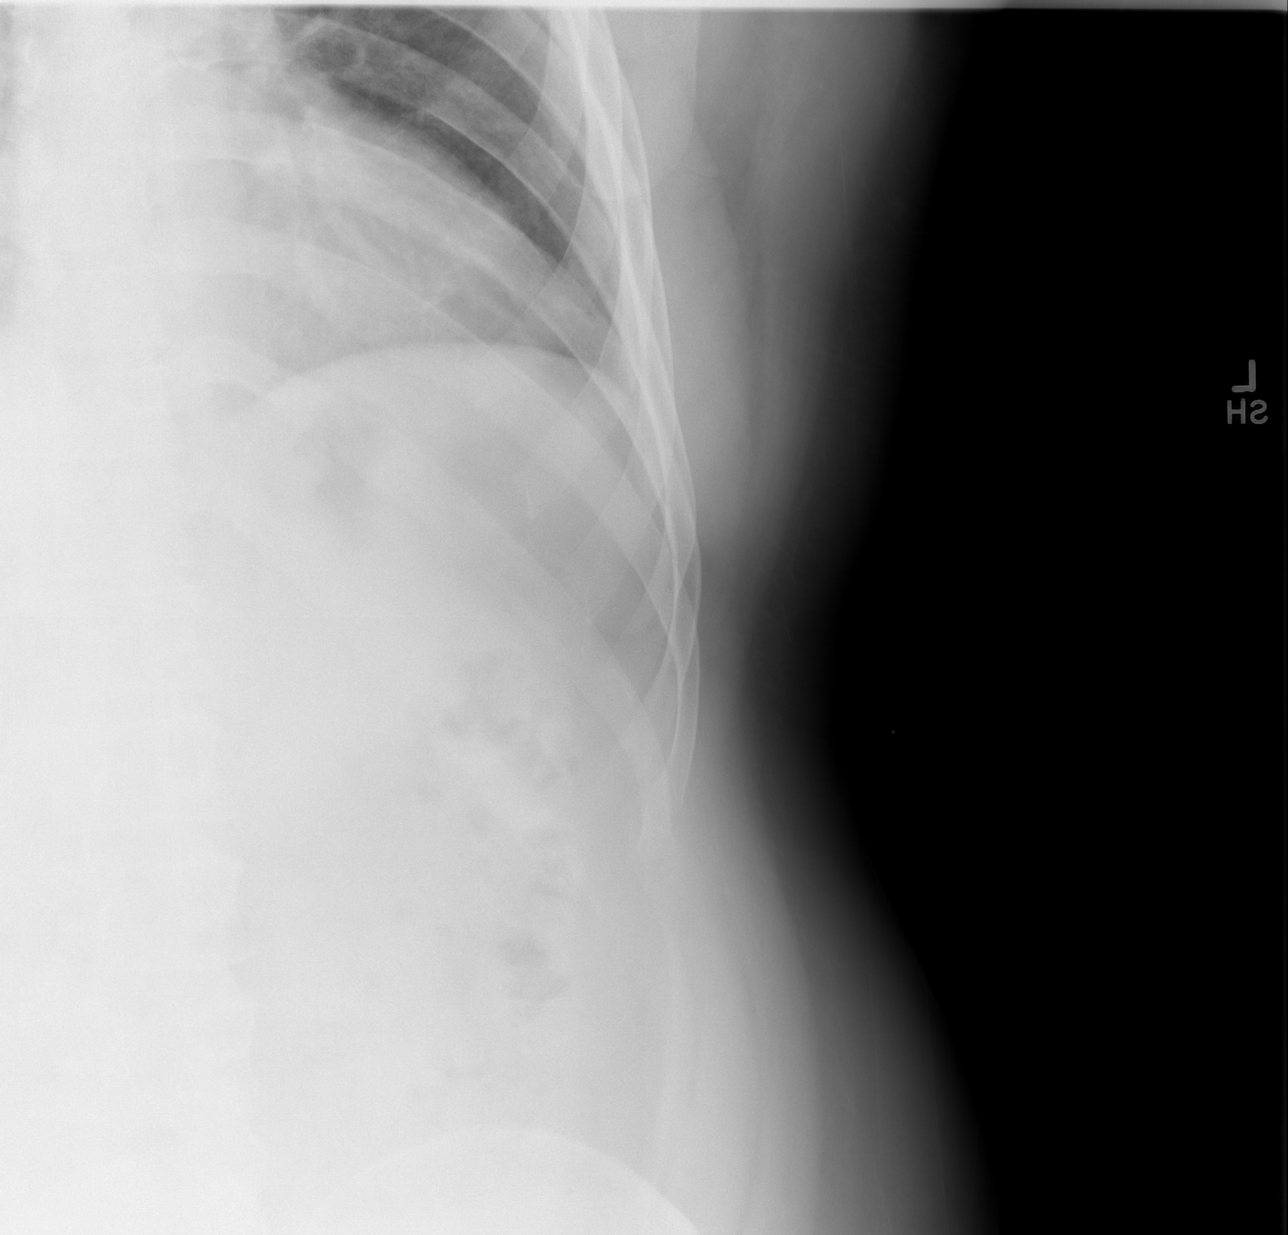

[w ribs oblique left]
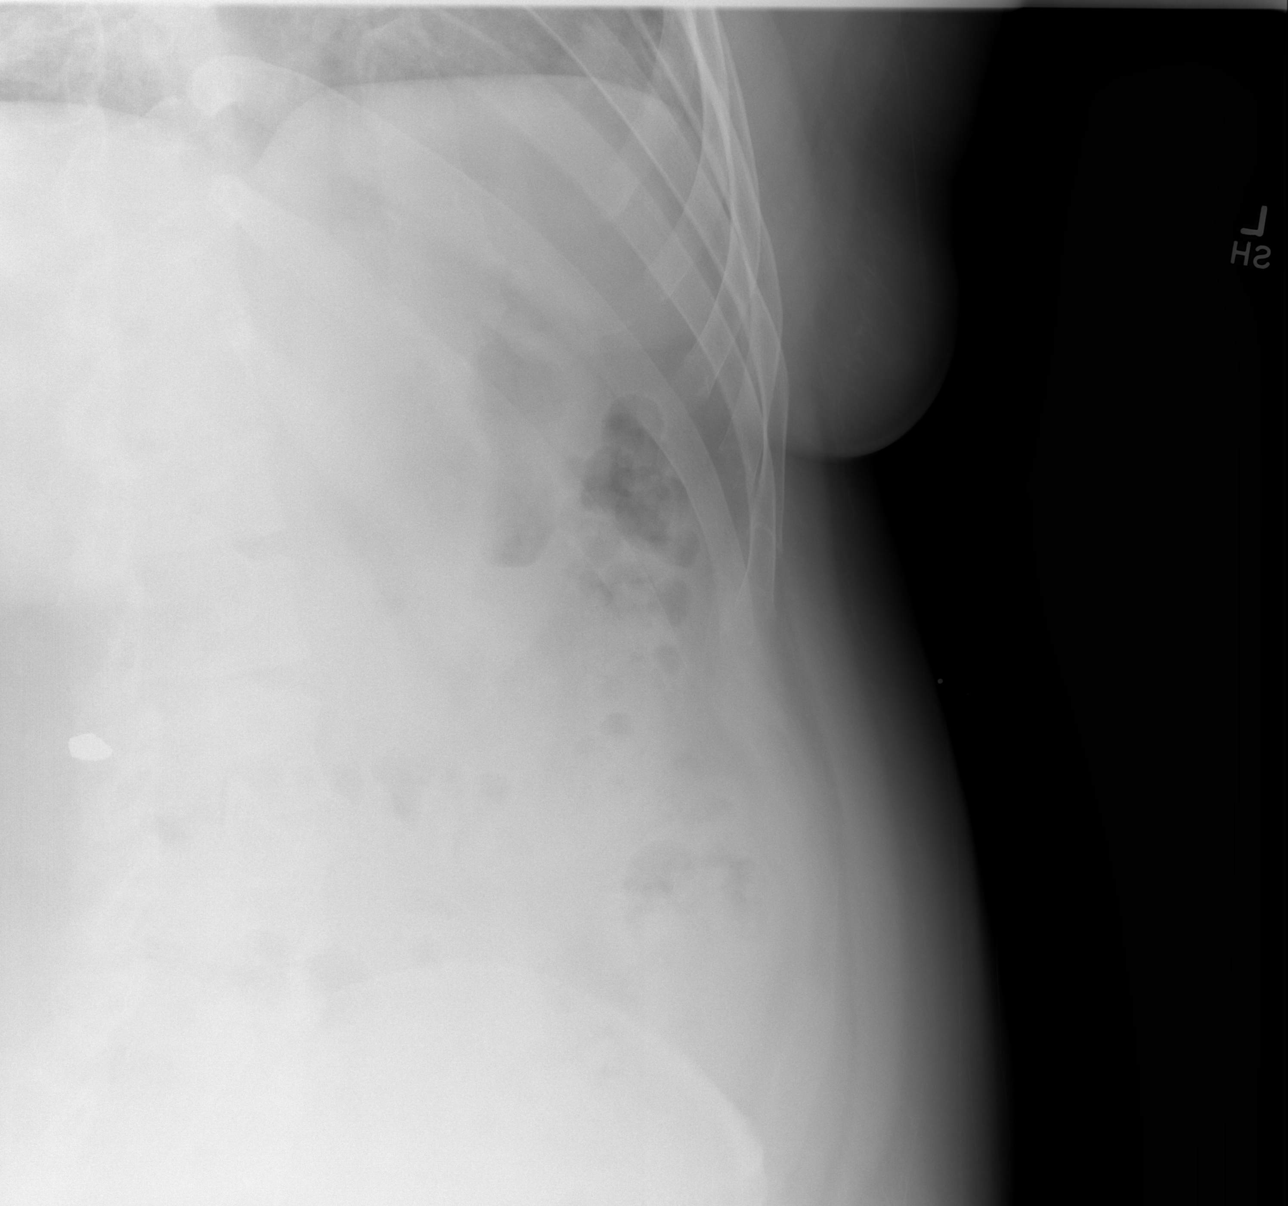

[3 of 3 positions shown; findings below may reference images not displayed]

FINDINGS: Frontal chest as well as oblique and cone-down lower rib images
obtained. The lungs are clear. The heart size and pulmonary
vascularity are normal. No adenopathy.

No pneumothorax or pleural effusion.  No evident rib fracture.
IMPRESSION: No evident rib fracture.  Lungs clear.

## 2022-04-29 ENCOUNTER — Emergency Department (HOSPITAL_BASED_OUTPATIENT_CLINIC_OR_DEPARTMENT_OTHER)
Admission: EM | Admit: 2022-04-29 | Discharge: 2022-04-29 | Disposition: A | Discharge status: Home / Self Care | Payer: Medicaid Other | Attending: Emergency Medicine | Admitting: Emergency Medicine

## 2022-04-29 ENCOUNTER — Encounter (HOSPITAL_BASED_OUTPATIENT_CLINIC_OR_DEPARTMENT_OTHER): Payer: Self-pay

## 2022-04-29 DIAGNOSIS — I10 Essential (primary) hypertension: Secondary | ICD-10-CM | POA: Diagnosis not present

## 2022-04-29 DIAGNOSIS — Z87891 Personal history of nicotine dependence: Secondary | ICD-10-CM | POA: Diagnosis not present

## 2022-04-29 DIAGNOSIS — Z79899 Other long term (current) drug therapy: Secondary | ICD-10-CM | POA: Insufficient documentation

## 2022-04-29 DIAGNOSIS — L02214 Cutaneous abscess of groin: Secondary | ICD-10-CM | POA: Diagnosis not present

## 2022-04-29 DIAGNOSIS — R200 Anesthesia of skin: Secondary | ICD-10-CM | POA: Diagnosis present

## 2022-04-29 MED ORDER — SULFAMETHOXAZOLE-TRIMETHOPRIM 800-160 MG PO TABS: 1.0000 | ORAL_TABLET | Freq: Two times a day (BID) | ORAL | 0 refills | Status: AC

## 2022-04-29 NOTE — Discharge Instructions (Addendum)
We evaluated you for your groin swelling.  Your physical exam showed an early abscess.  We discussed incision and drainage of your abscess but you wanted to try antibiotics first.  I prescribed 7 days worth of antibiotics.  Please take this twice daily.  Please also apply warm compresses to help the pus drained from the abscess.  You can apply a warm compress (washcloth with warm water) to the area for 30 minutes, 4 times a day to help drain pus.  We also discussed your numbness and tingling around your penis.  Please follow-up with urology so they can help manage this.  If your abscess does not improve, or your pain gets worse, you develop fevers or chills, your abscess grows in size, or you develop any other concerning symptoms please return to the emergency department.
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
## 2022-06-25 IMAGING — CR DG CHEST 2V
2 series · 2 of 2 positions shown · non-contrast
Comparison: 03/19/2021

CLINICAL DATA: Productive cough, chest pain

EXAM:
CHEST - 2 VIEW

[w chest pa]
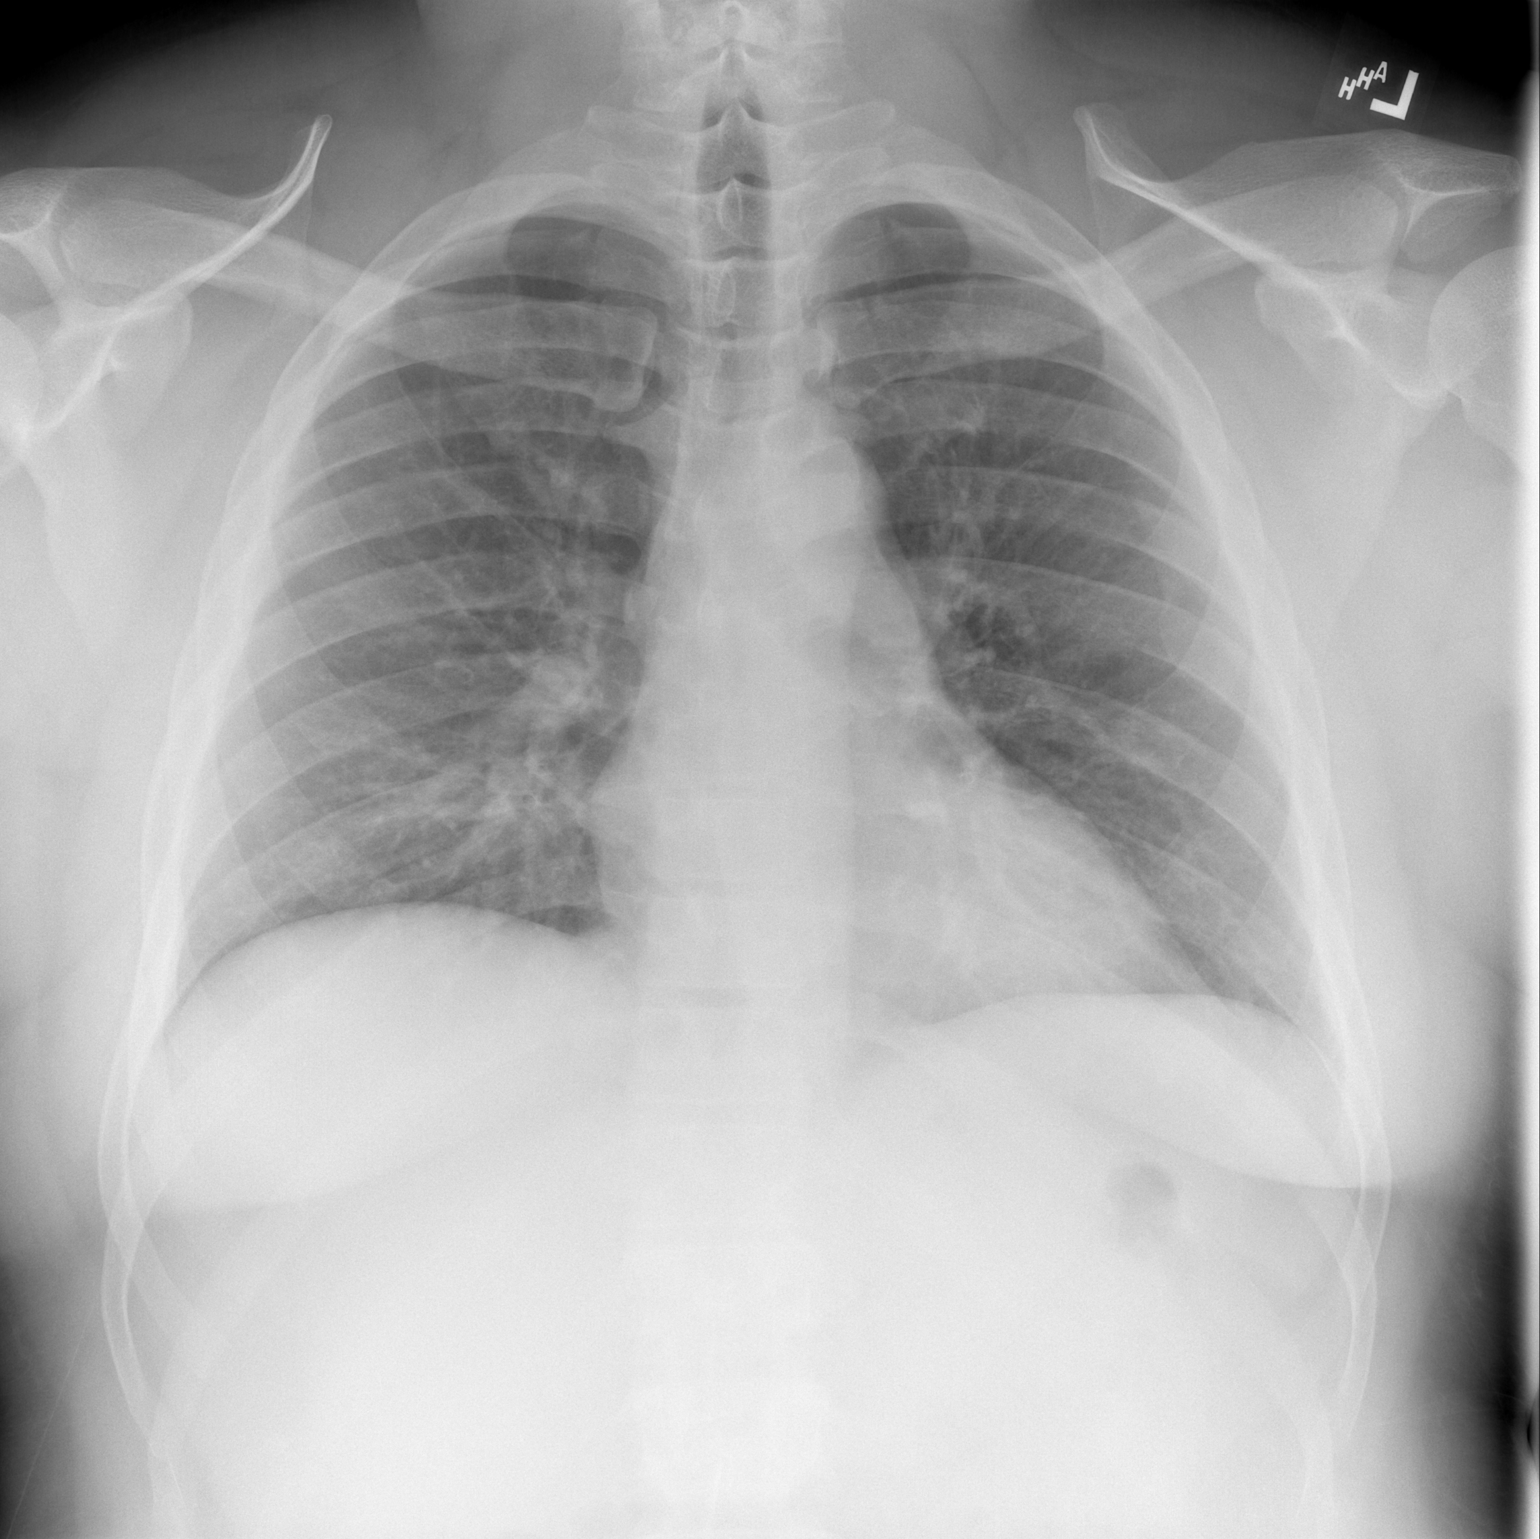

[w chest lat]
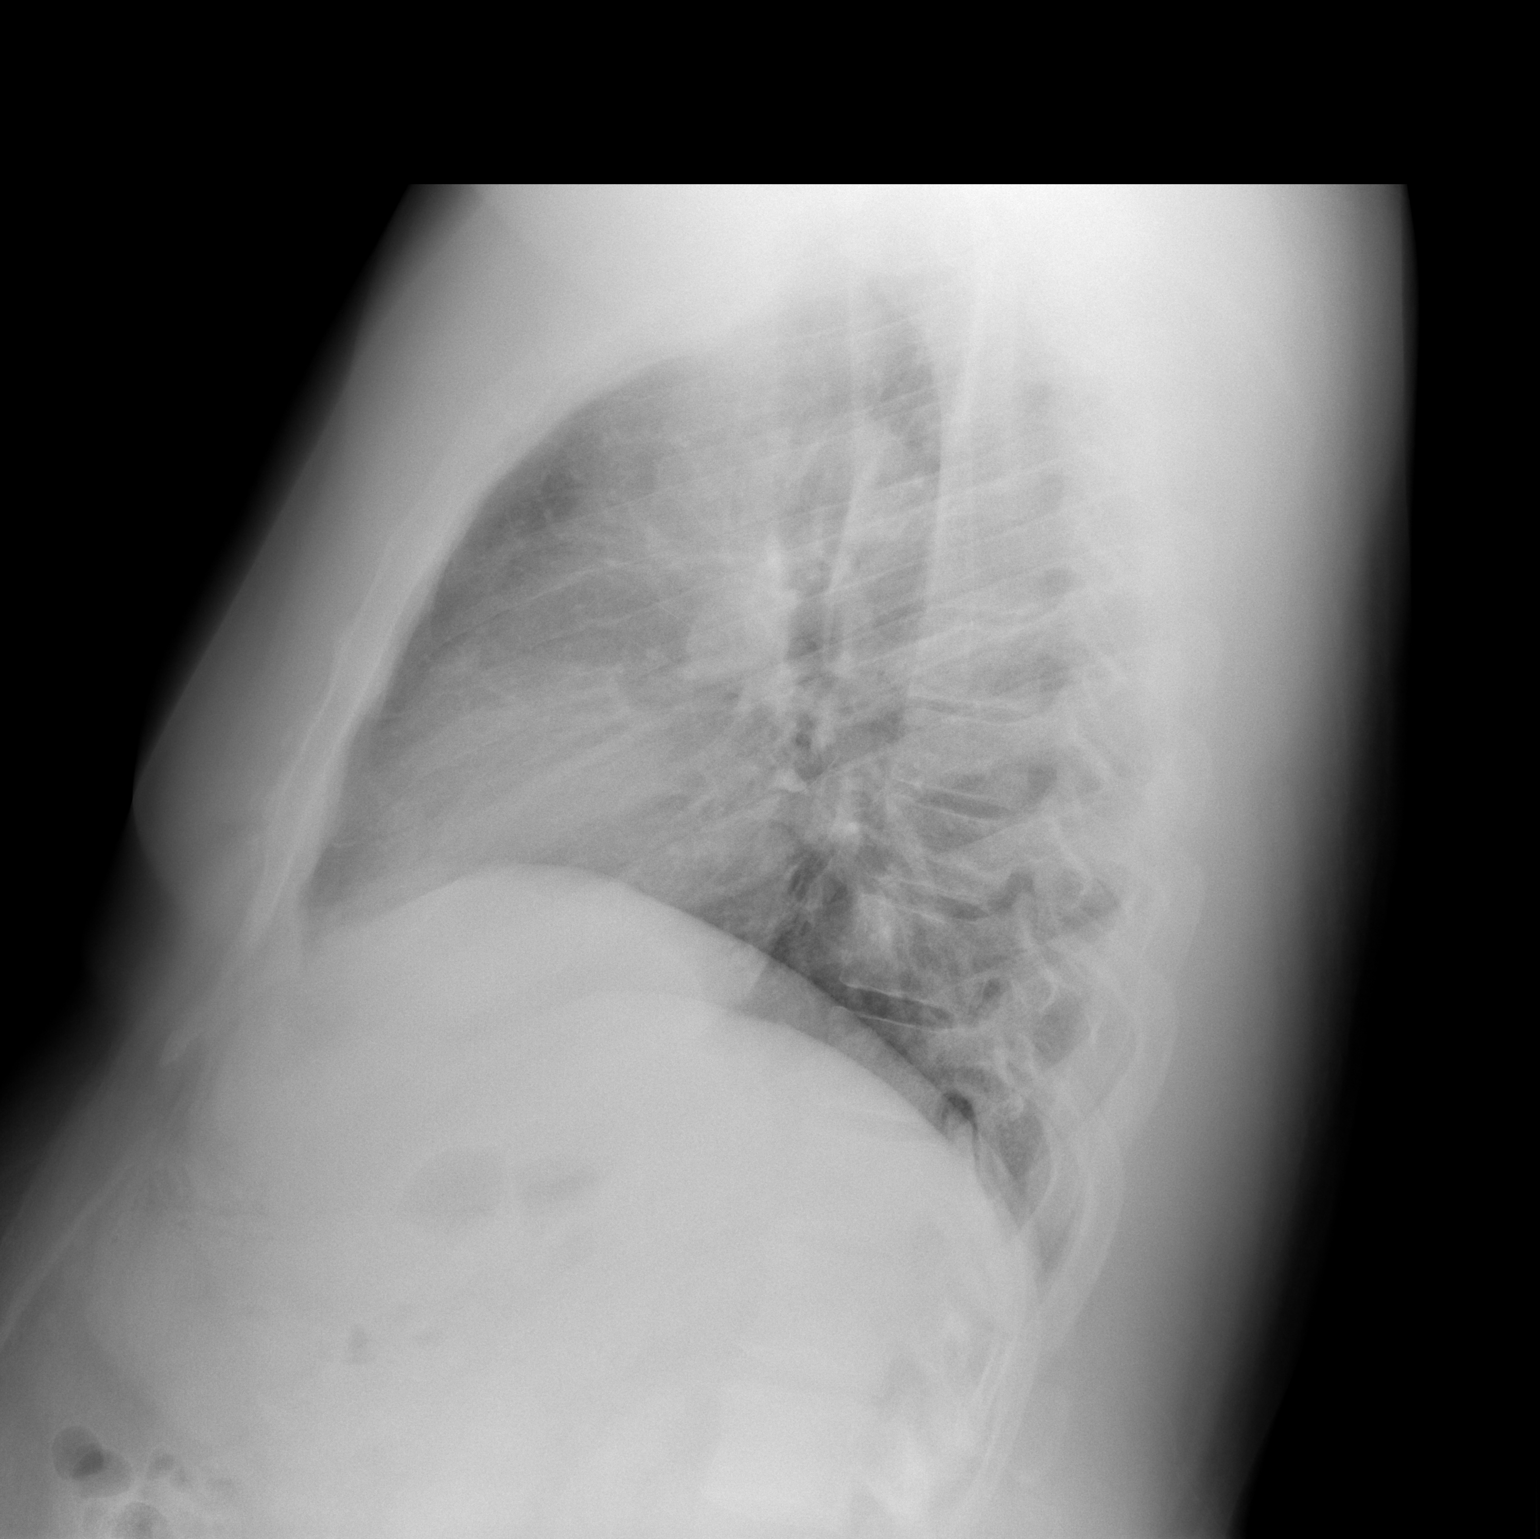

[2 of 2 positions shown; findings below may reference images not displayed]

FINDINGS: The heart size and mediastinal contours are within normal limits.
Both lungs are clear. The visualized skeletal structures are
unremarkable.
IMPRESSION: No active cardiopulmonary disease.

## 2022-07-27 ENCOUNTER — Encounter (HOSPITAL_BASED_OUTPATIENT_CLINIC_OR_DEPARTMENT_OTHER): Payer: Self-pay | Admitting: Emergency Medicine

## 2022-07-27 ENCOUNTER — Emergency Department (HOSPITAL_BASED_OUTPATIENT_CLINIC_OR_DEPARTMENT_OTHER): Payer: Commercial Managed Care - HMO

## 2022-07-27 ENCOUNTER — Emergency Department (HOSPITAL_BASED_OUTPATIENT_CLINIC_OR_DEPARTMENT_OTHER)
Admission: EM | Admit: 2022-07-27 | Discharge: 2022-07-27 | Disposition: A | Discharge status: Home / Self Care | Payer: Commercial Managed Care - HMO | Attending: Emergency Medicine | Admitting: Emergency Medicine

## 2022-07-27 ENCOUNTER — Other Ambulatory Visit: Payer: Self-pay

## 2022-07-27 DIAGNOSIS — I10 Essential (primary) hypertension: Secondary | ICD-10-CM | POA: Diagnosis not present

## 2022-07-27 DIAGNOSIS — S161XXA Strain of muscle, fascia and tendon at neck level, initial encounter: Secondary | ICD-10-CM | POA: Insufficient documentation

## 2022-07-27 DIAGNOSIS — X58XXXA Exposure to other specified factors, initial encounter: Secondary | ICD-10-CM | POA: Insufficient documentation

## 2022-07-27 DIAGNOSIS — M542 Cervicalgia: Secondary | ICD-10-CM | POA: Diagnosis present

## 2022-07-27 MED ORDER — CYCLOBENZAPRINE HCL 10 MG PO TABS
10.0000 mg | ORAL_TABLET | Freq: Once | ORAL | Status: AC
  Administered 2022-07-27: 10 mg via ORAL
  Filled 2022-07-27: qty 1

## 2022-07-27 MED ORDER — KETOROLAC TROMETHAMINE 60 MG/2ML IM SOLN
60.0000 mg | Freq: Once | INTRAMUSCULAR | Status: AC
  Administered 2022-07-27: 60 mg via INTRAMUSCULAR
  Filled 2022-07-27: qty 2

## 2022-07-27 MED ORDER — CYCLOBENZAPRINE HCL 10 MG PO TABS: 10.0000 mg | ORAL_TABLET | Freq: Two times a day (BID) | ORAL | 0 refills | Status: AC | PRN

## 2022-07-27 NOTE — ED Provider Notes (Signed)
[facility] Provider Note   CSN: [id number] Arrival date & time: 07/27/22  0945     History  Chief Complaint  Patient presents with   Shoulder Pain    Sean Castillo is a 37 y.o. male.  Patient is a 37 year old male with a history of hypertension who is presenting today with complaint of right-sided neck pain, head pain that goes into his shoulder.  This started last night and he reports it is a severe pain that is deep but does not seem to be easily rubbed out and is not going away.  It is worse with certain movements of his neck.  He denies any numbness or tingling in his right arm and denies any pain on the left side.  He has not noticed any swelling of his neck, difficulty swallowing or eating.  He denies any shortness of breath.  He does recall yesterday because he was not able to get to the gym he decided to lift his wife 50 times.  He initially did not think that this would cause these issues but now that he is thinking about it that the only thing he did different within the last 24 hours.  The history is provided by the patient.  Shoulder Pain      Home Medications Prior to Admission medications   Medication Sig Start Date End Date Taking? Authorizing Provider  cyclobenzaprine (FLEXERIL) 10 MG tablet Take 1 tablet (10 mg total) by mouth 2 (two) times daily as needed for muscle spasms. 07/27/22  Yes Rosaelena Kemnitz, Alphonzo Lemmings, MD  benzonatate (TESSALON) 100 MG capsule Take 1 capsule (100 mg total) by mouth every 8 (eight) hours. 06/25/21   Alvira Monday, MD  benzonatate (TESSALON) 100 MG capsule Take 1 capsule (100 mg total) by mouth every 8 (eight) hours. 06/25/21   Alvira Monday, MD  chlorhexidine (HIBICLENS) 4 % external liquid Apply topically daily as needed. 03/21/21   Sponseller, Lupe Carney R, PA-C  gabapentin (NEURONTIN) 300 MG capsule Take 1 capsule (300 mg total) by mouth 3 (three) times daily for 7 days. 08/31/20 09/07/20  Renne Crigler, PA-C  HYDROcodone-acetaminophen (NORCO/VICODIN) 5-325 MG tablet Take 1 tablet by mouth every 6 (six) hours as needed for severe pain. 08/31/20   Renne Crigler, PA-C  ibuprofen (ADVIL) 800 MG tablet Take 1 tablet (800 mg total) by mouth every 8 (eight) hours as needed (for pain). 10/01/18   Molpus, John, MD  mometasone (NASONEX) 50 MCG/ACT nasal spray Place 2 sprays into the nose daily. 03/18/17   Cartner, Sharlet Salina, PA-C  polyethylene glycol (MIRALAX / GLYCOLAX) packet Take by mouth. 03/24/18   [provider]  senna-docusate (SENOKOT-S) 8.6-50 MG tablet Take by mouth. 03/24/18   [provider]  valACYclovir (VALTREX) 1000 MG tablet Take 1 tablet (1,000 mg total) by mouth 3 (three) times daily. 04/16/21   Gilda Crease, MD      Allergies    Aspirin    Review of Systems   Review of Systems  Physical Exam Updated Vital Signs BP (!) 124/103   Pulse 66   Temp 98.5 F (36.9 C) (Oral)   Resp 16   SpO2 96%  Physical Exam Vitals and nursing note reviewed.  Constitutional:      General: He is not in acute distress.    Appearance: He is well-developed.  HENT:     Head: Normocephalic and atraumatic.  Eyes:     Conjunctiva/sclera: Conjunctivae normal.     Pupils:  Pupils are equal, round, and reactive to light.  Neck:      Comments: No cervical adenopathy or anterior neck pain or swelling Cardiovascular:     Rate and Rhythm: Normal rate.     Pulses: Normal pulses.     Heart sounds: No murmur heard. Pulmonary:     Effort: Pulmonary effort is normal. No respiratory distress.     Breath sounds: Normal breath sounds.  Musculoskeletal:        General: Normal range of motion.     Cervical back: Normal range of motion and neck supple. Tenderness present. Muscular tenderness present. No spinous process tenderness.  Skin:    General: Skin is warm and dry.     Findings: No erythema or rash.  Neurological:     Mental Status: He is alert and oriented to person,  place, and time.  Psychiatric:        Behavior: Behavior normal.     ED Results / Procedures / Treatments   Labs (all labs ordered are listed, but only abnormal results are displayed) Labs Reviewed - No data to display  EKG None  Radiology CT Cervical Spine Wo Contrast  Result Date: 07/27/2022 CLINICAL DATA:  Neck trauma few weeks ago with posterior numbness starting more recently. EXAM: CT CERVICAL SPINE WITHOUT CONTRAST TECHNIQUE: Multidetector CT imaging of the cervical spine was performed without intravenous contrast. Multiplanar CT image reconstructions were also generated. RADIATION DOSE REDUCTION: This exam was performed according to the departmental dose-optimization program which includes automated exposure control, adjustment of the mA and/or kV according to patient size and/or use of iterative reconstruction technique. COMPARISON:  None Available. FINDINGS: Alignment: Normal. Skull base and vertebrae: No acute fracture. No primary bone lesion or focal pathologic process. Soft tissues and spinal canal: No prevertebral fluid or swelling. No visible canal hematoma. Disc levels:  No degenerative changes Upper chest: Negative IMPRESSION: Normal cervical spine CT. Electronically Signed   By: Tiburcio Pea M.D.   On: 07/27/2022 10:25    Procedures Procedures    Medications Ordered in ED Medications  ketorolac (TORADOL) injection 60 mg (60 mg Intramuscular Given 07/27/22 1019)  cyclobenzaprine (FLEXERIL) tablet 10 mg (10 mg Oral Given 07/27/22 1018)    ED Course/ Medical Decision Making/ A&P                             Medical Decision Making Amount and/or Complexity of Data Reviewed Radiology: ordered and independent interpretation performed. Decision-making details documented in ED Course.  Risk Prescription drug management.   Patient presenting today with pain in his neck and shoulder.  Most likely musculoskeletal occurred last night but this was after he was lifting  his wife for exercise.  He is neurologically intact no weakness or numbness of his arm.  He is vascularly intact with no evidence of swelling in his neck, normal carotid pulse, normal pulse in the right upper extremity.  Bedside ultrasound with easily compressible internal and external jugular veins.  Patient given Toradol and Flexeril.  CT to evaluate for any type of fracture or disc bulge is pending.  10:50 AM I have independently visualized and interpreted pt's images today.  CT cervical spine neg for fx or lesions.  Radiology reports normal.  Will treat symptomatically for cervical strain.  Patient does not require any further testing at this time.  He is stable for discharge.         Final Clinical Impression(s) /  ED Diagnoses Final diagnoses:  Cervical strain, acute, initial encounter    Rx / DC Orders ED Discharge Orders          Ordered    cyclobenzaprine (FLEXERIL) 10 MG tablet  2 times daily PRN        07/27/22 1043              Gwyneth Sprout, MD 07/27/22 1050

## 2022-07-27 NOTE — ED Triage Notes (Signed)
Pt reports numbness from back of head to proximal R shoulder for the past 2 days. Had an injury to neck a couple weeks ago, but the symptoms started 3 days ago.

## 2022-07-27 NOTE — ED Notes (Signed)
ED Provider at bedside. 

## 2022-07-27 NOTE — Discharge Instructions (Signed)
Take 2 tylenol and 2 ibuprofen together every 6 hours as needed for pain.  Also use a heating pad.  Avoid lifting or holding your arms above your head as it may make your symptoms worse.  It will usually take about a week to get better.  Use the muscle relaxer as needed.

## 2022-08-23 ENCOUNTER — Other Ambulatory Visit: Payer: Self-pay

## 2022-08-23 DIAGNOSIS — J069 Acute upper respiratory infection, unspecified: Secondary | ICD-10-CM | POA: Diagnosis not present

## 2022-08-23 DIAGNOSIS — R059 Cough, unspecified: Secondary | ICD-10-CM | POA: Diagnosis present

## 2022-08-23 DIAGNOSIS — Z20822 Contact with and (suspected) exposure to covid-19: Secondary | ICD-10-CM | POA: Insufficient documentation

## 2022-08-24 ENCOUNTER — Emergency Department (HOSPITAL_BASED_OUTPATIENT_CLINIC_OR_DEPARTMENT_OTHER)
Admission: EM | Admit: 2022-08-24 | Discharge: 2022-08-24 | Disposition: A | Discharge status: Home / Self Care | Payer: Commercial Managed Care - HMO | Attending: Emergency Medicine | Admitting: Emergency Medicine

## 2022-08-24 ENCOUNTER — Encounter (HOSPITAL_BASED_OUTPATIENT_CLINIC_OR_DEPARTMENT_OTHER): Payer: Self-pay

## 2022-08-24 DIAGNOSIS — J069 Acute upper respiratory infection, unspecified: Secondary | ICD-10-CM

## 2022-08-24 LAB — RESP PANEL BY RT-PCR (RSV, FLU A&B, COVID)  RVPGX2
Influenza A by PCR: NEGATIVE
Influenza B by PCR: NEGATIVE
Resp Syncytial Virus by PCR: NEGATIVE
SARS Coronavirus 2 by RT PCR: NEGATIVE

## 2022-08-24 MED ORDER — AZITHROMYCIN 250 MG PO TABS: 250.0000 mg | ORAL_TABLET | Freq: Every day | ORAL | 0 refills | Status: AC

## 2022-08-24 NOTE — ED Provider Notes (Signed)
[place] [facility] Provider Note   CSN: [id number] Arrival date & time: 08/23/22  2358     History  Chief Complaint  Patient presents with   Cough   Nasal Congestion    Sean Castillo is a 37 y.o. male.  Patient is a 37 year old male with no significant past medical history.  Patient presenting today with complaints of URI symptoms.  He describes a several day history of nasal congestion, cough, and bodyaches.  He does report other members of the family sick with similar symptoms, but no known COVID exposures.  No chest pain or shortness of breath he has been taking Mucinex with little relief.  The history is provided by the patient.       Home Medications Prior to Admission medications   Medication Sig Start Date End Date Taking? Authorizing Provider  benzonatate (TESSALON) 100 MG capsule Take 1 capsule (100 mg total) by mouth every 8 (eight) hours. 06/25/21   Alvira Monday, MD  benzonatate (TESSALON) 100 MG capsule Take 1 capsule (100 mg total) by mouth every 8 (eight) hours. 06/25/21   Alvira Monday, MD  chlorhexidine (HIBICLENS) 4 % external liquid Apply topically daily as needed. 03/21/21   Sponseller, Eugene Gavia, PA-C  cyclobenzaprine (FLEXERIL) 10 MG tablet Take 1 tablet (10 mg total) by mouth 2 (two) times daily as needed for muscle spasms. 07/27/22   Gwyneth Sprout, MD  gabapentin (NEURONTIN) 300 MG capsule Take 1 capsule (300 mg total) by mouth 3 (three) times daily for 7 days. 08/31/20 09/07/20  Renne Crigler, PA-C  HYDROcodone-acetaminophen (NORCO/VICODIN) 5-325 MG tablet Take 1 tablet by mouth every 6 (six) hours as needed for severe pain. 08/31/20   Renne Crigler, PA-C  ibuprofen (ADVIL) 800 MG tablet Take 1 tablet (800 mg total) by mouth every 8 (eight) hours as needed (for pain). 10/01/18   Molpus, John, MD  mometasone (NASONEX) 50 MCG/ACT nasal spray Place 2 sprays into the nose daily. 03/18/17   Cartner, Sharlet Salina, PA-C   polyethylene glycol (MIRALAX / GLYCOLAX) packet Take by mouth. 03/24/18   [provider]  senna-docusate (SENOKOT-S) 8.6-50 MG tablet Take by mouth. 03/24/18   [provider]  valACYclovir (VALTREX) 1000 MG tablet Take 1 tablet (1,000 mg total) by mouth 3 (three) times daily. 04/16/21   Gilda Crease, MD      Allergies    Aspirin    Review of Systems   Review of Systems  All other systems reviewed and are negative.   Physical Exam Updated Vital Signs BP 128/82   Pulse 63   Temp (!) 97 F (36.1 C)   Resp 18   Ht 6' (1.829 m)   Wt 132.1 kg   SpO2 99%   BMI 39.51 kg/m  Physical Exam Vitals and nursing note reviewed.  Constitutional:      General: He is not in acute distress.    Appearance: He is well-developed. He is not diaphoretic.  HENT:     Head: Normocephalic and atraumatic.     Right Ear: Tympanic membrane normal.     Left Ear: Tympanic membrane normal.     Nose: Nose normal.     Mouth/Throat:     Mouth: Mucous membranes are moist.     Pharynx: No oropharyngeal exudate or posterior oropharyngeal erythema.  Cardiovascular:     Rate and Rhythm: Normal rate and regular rhythm.     Heart sounds: No murmur heard.    No friction rub.  Pulmonary:     Effort: Pulmonary effort is normal. No respiratory distress.     Breath sounds: Normal breath sounds. No wheezing or rales.  Abdominal:     General: Bowel sounds are normal. There is no distension.     Palpations: Abdomen is soft.     Tenderness: There is no abdominal tenderness.  Musculoskeletal:        General: Normal range of motion.     Cervical back: Normal range of motion and neck supple.  Skin:    General: Skin is warm and dry.  Neurological:     Mental Status: He is alert and oriented to person, place, and time.     Coordination: Coordination normal.     ED Results / Procedures / Treatments   Labs (all labs ordered are listed, but only abnormal results are displayed) Labs  Reviewed  RESP PANEL BY RT-PCR (RSV, FLU A&B, COVID)  [id number]    EKG None  Radiology No results found.  Procedures Procedures    Medications Ordered in ED Medications - No data to display  ED Course/ Medical Decision Making/ A&P  Patient presenting with URI symptoms for the past several days.  I suspect a viral etiology, and will recommend over-the-counter medications.  Patient will be provided prescription for Zithromax.  He can fill this if symptoms or not improving in the next few days.  Final Clinical Impression(s) / ED Diagnoses Final diagnoses:  None    Rx / DC Orders ED Discharge Orders     None         Geoffery Lyons, MD 08/24/22 [phone]

## 2022-08-24 NOTE — Discharge Instructions (Signed)
Drink plenty of fluids and get plenty of rest.  Take over-the-counter medications as needed for relief of symptoms.  If symptoms are not improving in the next 2 to 3 days, fill the prescription you have been given for Zithromax.

## 2022-08-24 NOTE — ED Triage Notes (Signed)
Pt reports chest congestion and cough x1 day.

## 2022-10-31 ENCOUNTER — Encounter (HOSPITAL_BASED_OUTPATIENT_CLINIC_OR_DEPARTMENT_OTHER): Payer: Self-pay | Admitting: Emergency Medicine

## 2022-10-31 ENCOUNTER — Other Ambulatory Visit: Payer: Self-pay

## 2022-10-31 ENCOUNTER — Emergency Department (HOSPITAL_BASED_OUTPATIENT_CLINIC_OR_DEPARTMENT_OTHER)
Admission: EM | Admit: 2022-10-31 | Discharge: 2022-10-31 | Discharge status: Against Medical Advice | Payer: Commercial Managed Care - HMO | Attending: Emergency Medicine | Admitting: Emergency Medicine

## 2022-10-31 DIAGNOSIS — Z5321 Procedure and treatment not carried out due to patient leaving prior to being seen by health care provider: Secondary | ICD-10-CM | POA: Diagnosis not present

## 2022-10-31 DIAGNOSIS — R21 Rash and other nonspecific skin eruption: Secondary | ICD-10-CM | POA: Insufficient documentation

## 2022-10-31 NOTE — ED Triage Notes (Signed)
Pt c/o itchy rash to BUE x 2d; thinks it is poison ivy; calamine lotion not helping

## 2022-11-04 ENCOUNTER — Other Ambulatory Visit: Payer: Self-pay

## 2022-11-04 ENCOUNTER — Emergency Department (HOSPITAL_BASED_OUTPATIENT_CLINIC_OR_DEPARTMENT_OTHER): Payer: Commercial Managed Care - HMO

## 2022-11-04 ENCOUNTER — Encounter (HOSPITAL_BASED_OUTPATIENT_CLINIC_OR_DEPARTMENT_OTHER): Payer: Self-pay | Admitting: Emergency Medicine

## 2022-11-04 DIAGNOSIS — R0789 Other chest pain: Secondary | ICD-10-CM | POA: Diagnosis not present

## 2022-11-04 NOTE — ED Notes (Signed)
Unable to get labs in triage 

## 2022-11-04 NOTE — ED Triage Notes (Signed)
Patient arrived via POV c/o left chest pain x 1 day. Patient states "feels like a knot". Patient is AO x 4, VS WDL, normal gait.

## 2022-11-05 ENCOUNTER — Other Ambulatory Visit (HOSPITAL_BASED_OUTPATIENT_CLINIC_OR_DEPARTMENT_OTHER): Payer: Self-pay

## 2022-11-05 ENCOUNTER — Other Ambulatory Visit: Payer: Self-pay

## 2022-11-05 ENCOUNTER — Emergency Department (HOSPITAL_BASED_OUTPATIENT_CLINIC_OR_DEPARTMENT_OTHER)
Admission: EM | Admit: 2022-11-05 | Discharge: 2022-11-05 | Disposition: A | Discharge status: Home / Self Care | Payer: Commercial Managed Care - HMO | Attending: Emergency Medicine | Admitting: Emergency Medicine

## 2022-11-05 DIAGNOSIS — R0789 Other chest pain: Secondary | ICD-10-CM

## 2022-11-05 LAB — CBC
HCT: 39 % (ref 39.0–52.0)
Hemoglobin: 13.1 g/dL (ref 13.0–17.0)
MCH: 27.9 pg (ref 26.0–34.0)
MCHC: 33.6 g/dL (ref 30.0–36.0)
MCV: 83 fL (ref 80.0–100.0)
Platelets: 245 K/uL (ref 150–400)
RBC: 4.7 MIL/uL (ref 4.22–5.81)
RDW: 14.6 % (ref 11.5–15.5)
WBC: 14.5 K/uL — ABNORMAL HIGH (ref 4.0–10.5)
nRBC: 0 % (ref 0.0–0.2)

## 2022-11-05 LAB — BASIC METABOLIC PANEL WITH GFR
Anion gap: 11 (ref 5–15)
BUN: 12 mg/dL (ref 6–20)
CO2: 23 mmol/L (ref 22–32)
Calcium: 9.5 mg/dL (ref 8.9–10.3)
Chloride: 103 mmol/L (ref 98–111)
Creatinine, Ser: 1.07 mg/dL (ref 0.61–1.24)
GFR, Estimated: >60 mL/min
Glucose, Bld: 104 mg/dL — ABNORMAL HIGH (ref 70–99)
Potassium: 4.2 mmol/L (ref 3.5–5.1)
Sodium: 137 mmol/L (ref 135–145)

## 2022-11-05 LAB — TROPONIN I (HIGH SENSITIVITY): Troponin I (High Sensitivity): 4 ng/L (ref <18)

## 2022-11-05 MED ORDER — LIDOCAINE 5 % EX PTCH
1.0000 | MEDICATED_PATCH | CUTANEOUS | Status: DC
  Administered 2022-11-05: 1 via TRANSDERMAL
  Filled 2022-11-05: qty 1

## 2022-11-05 MED ORDER — LIDOCAINE 5 % EX PTCH
1.0000 | MEDICATED_PATCH | CUTANEOUS | 0 refills | Status: AC
  Filled 2022-11-05: qty 30, 30d supply, fill #0
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
## 2022-12-04 ENCOUNTER — Other Ambulatory Visit (HOSPITAL_BASED_OUTPATIENT_CLINIC_OR_DEPARTMENT_OTHER): Payer: Self-pay

## 2023-06-24 ENCOUNTER — Emergency Department (HOSPITAL_BASED_OUTPATIENT_CLINIC_OR_DEPARTMENT_OTHER): Payer: MEDICAID

## 2023-06-24 ENCOUNTER — Encounter (HOSPITAL_BASED_OUTPATIENT_CLINIC_OR_DEPARTMENT_OTHER): Payer: Self-pay | Admitting: Emergency Medicine

## 2023-06-24 ENCOUNTER — Emergency Department (HOSPITAL_BASED_OUTPATIENT_CLINIC_OR_DEPARTMENT_OTHER)
Admission: EM | Admit: 2023-06-24 | Discharge: 2023-06-24 | Disposition: A | Discharge status: Home / Self Care | Payer: MEDICAID | Attending: Emergency Medicine | Admitting: Emergency Medicine

## 2023-06-24 ENCOUNTER — Other Ambulatory Visit: Payer: Self-pay

## 2023-06-24 DIAGNOSIS — M67431 Ganglion, right wrist: Secondary | ICD-10-CM | POA: Diagnosis not present

## 2023-06-24 DIAGNOSIS — M6740 Ganglion, unspecified site: Secondary | ICD-10-CM

## 2023-06-24 DIAGNOSIS — R0789 Other chest pain: Secondary | ICD-10-CM | POA: Diagnosis not present

## 2023-06-24 DIAGNOSIS — R079 Chest pain, unspecified: Secondary | ICD-10-CM | POA: Diagnosis present

## 2023-06-24 LAB — CBC
HCT: 39 % (ref 39.0–52.0)
Hemoglobin: 13.2 g/dL (ref 13.0–17.0)
MCH: 28.4 pg (ref 26.0–34.0)
MCHC: 33.8 g/dL (ref 30.0–36.0)
MCV: 84.1 fL (ref 80.0–100.0)
Platelets: 431 10*3/uL — ABNORMAL HIGH (ref 150–400)
RBC: 4.64 MIL/uL (ref 4.22–5.81)
RDW: 13.5 % (ref 11.5–15.5)
WBC: 12 10*3/uL — ABNORMAL HIGH (ref 4.0–10.5)
nRBC: 0 % (ref 0.0–0.2)

## 2023-06-24 LAB — BASIC METABOLIC PANEL WITH GFR
Anion gap: 7 (ref 5–15)
BUN: 13 mg/dL (ref 6–20)
CO2: 24 mmol/L (ref 22–32)
Calcium: 8.9 mg/dL (ref 8.9–10.3)
Chloride: 106 mmol/L (ref 98–111)
Creatinine, Ser: 0.94 mg/dL (ref 0.61–1.24)
GFR, Estimated: >60 mL/min (ref >60)
Glucose, Bld: 105 mg/dL — ABNORMAL HIGH (ref 70–99)
Potassium: 3.9 mmol/L (ref 3.5–5.1)
Sodium: 137 mmol/L (ref 135–145)

## 2023-06-24 LAB — TROPONIN I (HIGH SENSITIVITY): Troponin I (High Sensitivity): 4 ng/L (ref <18)
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
## 2023-06-24 NOTE — ED Triage Notes (Signed)
 Pt reports CP x 10d, denies any other s/s  Also c/o RT wrist pain, denies any injuries

## 2023-06-24 NOTE — Discharge Instructions (Addendum)
 As we had discussed there is information in this paperwork to try and establish care with a primary care provider.  I think this would be important for you to get your cholesterol checked as well as to have then perhaps refer you for a sleep study.  I have also given you information for the hand surgeon in case you want to have them evaluate your cyst for possible removal.

## 2023-07-22 ENCOUNTER — Emergency Department (HOSPITAL_BASED_OUTPATIENT_CLINIC_OR_DEPARTMENT_OTHER): Payer: MEDICAID

## 2023-07-22 ENCOUNTER — Emergency Department (HOSPITAL_BASED_OUTPATIENT_CLINIC_OR_DEPARTMENT_OTHER)
Admission: EM | Admit: 2023-07-22 | Discharge: 2023-07-22 | Disposition: A | Discharge status: Home / Self Care | Payer: MEDICAID | Attending: Emergency Medicine | Admitting: Emergency Medicine

## 2023-07-22 ENCOUNTER — Encounter (HOSPITAL_BASED_OUTPATIENT_CLINIC_OR_DEPARTMENT_OTHER): Payer: Self-pay | Admitting: Emergency Medicine

## 2023-07-22 ENCOUNTER — Other Ambulatory Visit: Payer: Self-pay

## 2023-07-22 DIAGNOSIS — I10 Essential (primary) hypertension: Secondary | ICD-10-CM | POA: Diagnosis not present

## 2023-07-22 DIAGNOSIS — Y92410 Unspecified street and highway as the place of occurrence of the external cause: Secondary | ICD-10-CM | POA: Insufficient documentation

## 2023-07-22 DIAGNOSIS — R0781 Pleurodynia: Secondary | ICD-10-CM | POA: Diagnosis present

## 2023-07-22 DIAGNOSIS — Z79899 Other long term (current) drug therapy: Secondary | ICD-10-CM | POA: Insufficient documentation

## 2023-07-22 DIAGNOSIS — M25522 Pain in left elbow: Secondary | ICD-10-CM | POA: Insufficient documentation

## 2023-07-22 DIAGNOSIS — R519 Headache, unspecified: Secondary | ICD-10-CM | POA: Insufficient documentation

## 2023-07-22 DIAGNOSIS — M7918 Myalgia, other site: Secondary | ICD-10-CM

## 2023-07-22 DIAGNOSIS — Z720 Tobacco use: Secondary | ICD-10-CM | POA: Insufficient documentation

## 2023-07-22 MED ORDER — METHOCARBAMOL 500 MG PO TABS
500.0000 mg | ORAL_TABLET | Freq: Two times a day (BID) | ORAL | 0 refills | Status: AC
Start: 2023-07-22 — End: ?
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
## 2023-10-08 ENCOUNTER — Emergency Department (HOSPITAL_BASED_OUTPATIENT_CLINIC_OR_DEPARTMENT_OTHER)
Admission: EM | Admit: 2023-10-08 | Discharge: 2023-10-08 | Disposition: A | Discharge status: Home / Self Care | Payer: MEDICAID | Attending: Emergency Medicine | Admitting: Emergency Medicine

## 2023-10-08 ENCOUNTER — Encounter (HOSPITAL_BASED_OUTPATIENT_CLINIC_OR_DEPARTMENT_OTHER): Payer: Self-pay

## 2023-10-08 ENCOUNTER — Other Ambulatory Visit: Payer: Self-pay

## 2023-10-08 DIAGNOSIS — M67431 Ganglion, right wrist: Secondary | ICD-10-CM | POA: Insufficient documentation

## 2023-10-08 MED ORDER — LIDOCAINE 5 % EX PTCH
1.0000 | MEDICATED_PATCH | CUTANEOUS | Status: DC
  Administered 2023-10-08: 1 via TRANSDERMAL
  Filled 2023-10-08: qty 1

## 2023-10-08 MED ORDER — LIDOCAINE 5 % EX PTCH: 1.0000 | MEDICATED_PATCH | CUTANEOUS | 0 refills | Status: AC

## 2023-10-08 NOTE — ED Provider Notes (Signed)
 [place] [facility] Provider Note   CSN: [id number] Arrival date & time: 10/08/23  9772     Patient presents with: rt. wrist pain   Sean Castillo is a 38 y.o. male.   The history is provided by the patient.  Wrist Pain This is a recurrent problem. The problem occurs constantly. The problem has not changed since onset.Pertinent negatives include no chest pain, no abdominal pain, no headaches and no shortness of breath. Nothing aggravates the symptoms. Nothing relieves the symptoms. The treatment provided no relief.  Patient with known ganglion cyst of the right wrist who has seen hand and had it drained but it returned.  Reports he cannot be seen until the end of the year.       Prior to Admission medications   Medication Sig Start Date End Date Taking? Authorizing Provider  lidocaine  (LIDODERM ) 5 % Place 1 patch onto the skin daily. Remove & Discard patch within 12 hours or as directed by MD 10/08/23  Yes Abdimalik Mayorquin, MD  azithromycin  (ZITHROMAX ) 250 MG tablet Take 1 tablet (250 mg total) by mouth daily. Take first 2 tablets together, then 1 every day until finished. 08/24/22   Geroldine Berg, MD  benzonatate  (TESSALON ) 100 MG capsule Take 1 capsule (100 mg total) by mouth every 8 (eight) hours. 06/25/21   Dreama Longs, MD  benzonatate  (TESSALON ) 100 MG capsule Take 1 capsule (100 mg total) by mouth every 8 (eight) hours. 06/25/21   Dreama Longs, MD  chlorhexidine  (HIBICLENS ) 4 % external liquid Apply topically daily as needed. 03/21/21   Sponseller, Rebekah R, PA-C  cyclobenzaprine  (FLEXERIL ) 10 MG tablet Take 1 tablet (10 mg total) by mouth 2 (two) times daily as needed for muscle spasms. 07/27/22   Doretha Folks, MD  gabapentin  (NEURONTIN ) 300 MG capsule Take 1 capsule (300 mg total) by mouth 3 (three) times daily for 7 days. 08/31/20 09/07/20  Geiple, Joshua, PA-C  HYDROcodone -acetaminophen  (NORCO/VICODIN) 5-325 MG tablet Take 1 tablet by  mouth every 6 (six) hours as needed for severe pain. 08/31/20   Geiple, Joshua, PA-C  ibuprofen  (ADVIL ) 800 MG tablet Take 1 tablet (800 mg total) by mouth every 8 (eight) hours as needed (for pain). 10/01/18   Molpus, John, MD  lidocaine  (LIDODERM ) 5 % Place 1 patch onto the skin daily. Remove & Discard patch within 12 hours or as directed by MD. 11/05/22   Laterria Lasota, MD  methocarbamol  (ROBAXIN ) 500 MG tablet Take 1 tablet (500 mg total) by mouth 2 (two) times daily. 07/22/23   Arellano Zameza, Priscila, MD  mometasone  (NASONEX ) 50 MCG/ACT nasal spray Place 2 sprays into the nose daily. 03/18/17   Cartner, Morene, PA-C  polyethylene glycol (MIRALAX / GLYCOLAX) packet Take by mouth. 03/24/18   [provider]  senna-docusate (SENOKOT-S) 8.6-50 MG tablet Take by mouth. 03/24/18   [provider]  valACYclovir  (VALTREX ) 1000 MG tablet Take 1 tablet (1,000 mg total) by mouth 3 (three) times daily. 04/16/21   Haze Lonni PARAS, MD    Allergies: Aspirin and Ondansetron    Review of Systems  Respiratory:  Negative for shortness of breath.   Cardiovascular:  Negative for chest pain.  Gastrointestinal:  Negative for abdominal pain.  Musculoskeletal:  Positive for arthralgias.  Neurological:  Negative for headaches.  All other systems reviewed and are negative.   Updated Vital Signs BP (!) 140/92   Pulse 75   Temp 98.1 F (36.7 C)   Resp 18  Ht 6' (1.829 m)   Wt (!) 142.9 kg   SpO2 97%   BMI 42.72 kg/m   Physical Exam Vitals and nursing note reviewed.  Constitutional:      General: He is not in acute distress.    Appearance: He is well-developed. He is not diaphoretic.  HENT:     Head: Normocephalic and atraumatic.  Eyes:     Conjunctiva/sclera: Conjunctivae normal.     Pupils: Pupils are equal, round, and reactive to light.  Cardiovascular:     Rate and Rhythm: Normal rate and regular rhythm.     Pulses: Normal pulses.     Heart sounds: Normal heart sounds.   Pulmonary:     Effort: Pulmonary effort is normal.     Breath sounds: Normal breath sounds. No wheezing or rales.  Abdominal:     General: Bowel sounds are normal.     Palpations: Abdomen is soft.     Tenderness: There is no abdominal tenderness. There is no guarding or rebound.  Musculoskeletal:        General: Normal range of motion.       Arms:     Cervical back: Normal range of motion and neck supple.  Skin:    General: Skin is warm and dry.     Capillary Refill: Capillary refill takes less than 2 seconds.  Neurological:     General: No focal deficit present.     Mental Status: He is alert and oriented to person, place, and time.     (all labs ordered are listed, but only abnormal results are displayed) Labs Reviewed - No data to display  EKG: None  Radiology: No results found.   Procedures   Medications Ordered in the ED  lidocaine  (LIDODERM ) 5 % 1 patch (has no administration in time range)                                    Medical Decision Making Recurrent ganglion cyst   Amount and/or Complexity of Data Reviewed Independent Historian: spouse    Details: See above  External Data Reviewed: notes.    Details: Previous ED visit reviewed   Risk Prescription drug management. Risk Details: Ice, ace wrap and following up with hand surgery for ongoing care.       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 The patient is nontoxic-appearing on exam and vital signs are within normal limits.  I have reviewed the triage vital signs and the nursing notes. Pertinent labs & imaging results that were available during my care of the patient were reviewed by me and considered in my medical decision making (see chart for details). After history, exam, and medical workup I feel the patient has been appropriately medically screened and is safe for discharge home. Pertinent diagnoses were discussed with the  patient. Patient was given return precautions.       ED Discharge Orders          Ordered    lidocaine  (LIDODERM ) 5 %  Every 24 hours        10/08/23 0246               Zebastian Carico, MD 10/08/23 9749

## 2023-10-08 NOTE — ED Triage Notes (Signed)
 Pt states he has seen MD about ganglion cyst on his rt. Wrist. (A month ago) Pt did have it drained but has return with pain and numbness

## 2023-10-10 ENCOUNTER — Telehealth: Payer: Self-pay | Admitting: Radiology

## 2023-10-10 NOTE — Telephone Encounter (Signed)
 Patient called, needs appt to see Dr Erwin.  See recent ED visit in chart.  Please advise on where we can schedule this one?  I think we previously had tried getting him scheduled, unsure of details.  He says he is hurting all the time now.

## 2023-10-10 NOTE — Telephone Encounter (Signed)
 I will call, will have to work in

## 2023-10-10 NOTE — Telephone Encounter (Signed)
Number in chart is out of service

## 2024-01-19 ENCOUNTER — Encounter: Payer: Self-pay | Admitting: Radiology

## 2024-03-29 ENCOUNTER — Emergency Department (HOSPITAL_BASED_OUTPATIENT_CLINIC_OR_DEPARTMENT_OTHER): Payer: MEDICAID

## 2024-03-29 ENCOUNTER — Other Ambulatory Visit: Payer: Self-pay

## 2024-03-29 ENCOUNTER — Emergency Department (HOSPITAL_BASED_OUTPATIENT_CLINIC_OR_DEPARTMENT_OTHER)
Admission: EM | Admit: 2024-03-29 | Discharge: 2024-03-29 | Disposition: A | Discharge status: Home / Self Care | Payer: MEDICAID

## 2024-03-29 ENCOUNTER — Other Ambulatory Visit (HOSPITAL_BASED_OUTPATIENT_CLINIC_OR_DEPARTMENT_OTHER): Payer: Self-pay

## 2024-03-29 DIAGNOSIS — F17200 Nicotine dependence, unspecified, uncomplicated: Secondary | ICD-10-CM | POA: Diagnosis not present

## 2024-03-29 DIAGNOSIS — R0789 Other chest pain: Secondary | ICD-10-CM | POA: Insufficient documentation

## 2024-03-29 DIAGNOSIS — I10 Essential (primary) hypertension: Secondary | ICD-10-CM | POA: Insufficient documentation

## 2024-03-29 DIAGNOSIS — R519 Headache, unspecified: Secondary | ICD-10-CM | POA: Insufficient documentation

## 2024-03-29 LAB — CBC
HCT: 39.9 % (ref 39.0–52.0)
Hemoglobin: 13.6 g/dL (ref 13.0–17.0)
MCH: 28.9 pg (ref 26.0–34.0)
MCHC: 34.1 g/dL (ref 30.0–36.0)
MCV: 84.9 fL (ref 80.0–100.0)
Platelets: 384 K/uL (ref 150–400)
RBC: 4.7 MIL/uL (ref 4.22–5.81)
RDW: 13.9 % (ref 11.5–15.5)
WBC: 10.2 K/uL (ref 4.0–10.5)
nRBC: 0 % (ref 0.0–0.2)

## 2024-03-29 LAB — BASIC METABOLIC PANEL WITH GFR
Anion gap: 11 (ref 5–15)
BUN: 10 mg/dL (ref 6–20)
CO2: 22 mmol/L (ref 22–32)
Calcium: 8.7 mg/dL — ABNORMAL LOW (ref 8.9–10.3)
Chloride: 105 mmol/L (ref 98–111)
Creatinine, Ser: 0.93 mg/dL (ref 0.61–1.24)
GFR, Estimated: >60 mL/min
Glucose, Bld: 103 mg/dL — ABNORMAL HIGH (ref 70–99)
Potassium: 4.3 mmol/L (ref 3.5–5.1)
Sodium: 138 mmol/L (ref 135–145)

## 2024-03-29 LAB — TROPONIN T, HIGH SENSITIVITY: Troponin T High Sensitivity: <15 ng/L (ref 0–19)

## 2024-03-29 MED ORDER — NAPROXEN 375 MG PO TABS
375.0000 mg | ORAL_TABLET | Freq: Two times a day (BID) | ORAL | 0 refills | Status: AC
  Filled 2024-03-29: qty 20, 10d supply, fill #0

## 2024-03-29 MED ORDER — KETOROLAC TROMETHAMINE 60 MG/2ML IM SOLN
60.0000 mg | Freq: Once | INTRAMUSCULAR | Status: AC
  Administered 2024-03-29: 60 mg via INTRAMUSCULAR
  Filled 2024-03-29: qty 2

## 2024-03-29 MED ORDER — METOCLOPRAMIDE HCL 10 MG PO TABS
10.0000 mg | ORAL_TABLET | Freq: Once | ORAL | Status: AC
  Administered 2024-03-29: 10 mg via ORAL
  Filled 2024-03-29: qty 1

## 2024-03-29 NOTE — Discharge Instructions (Signed)
 You have been diagnosed by your caregiver as having chest wall pain. SEEK IMMEDIATE MEDICAL ATTENTION IF: You develop a fever.  Your chest pains become severe or intolerable.  You develop new, unexplained symptoms (problems).  You develop shortness of breath, nausea, vomiting, sweating or feel light headed.  You develop a new cough or you cough up blood.   You are having a headache. No specific cause was found today for your headache. It may have been a migraine or other cause of headache. Stress, anxiety, fatigue, and depression are common triggers for headaches. Your headache today does not appear to be life-threatening or require hospitalization, but often the exact cause of headaches is not determined in the emergency department. Therefore, follow-up with your doctor is very important to find out what may have caused your headache, and whether or not you need any further diagnostic testing or treatment. Sometimes headaches can appear benign (not harmful), but then more serious symptoms can develop which should prompt an immediate re-evaluation by your doctor or the emergency department. SEEK MEDICAL ATTENTION IF: You develop possible problems with medications prescribed.  The medications don't resolve your headache, if it recurs , or if you have multiple episodes of vomiting or can't take fluids. You have a change from the usual headache. RETURN IMMEDIATELY IF you develop a sudden, severe headache or confusion, become poorly responsive or faint, develop a fever above 100.20F or problem breathing, have a change in speech, vision, swallowing, or understanding, or develop new weakness, numbness, tingling, incoordination, or have a seizure.

## 2024-03-29 NOTE — ED Triage Notes (Signed)
 Pt c/o mid chest pain x 2 days, along with headache x 3 days.   Reports BLLE swelling x 2 days.   Also reports throat swelling, I think its my tonsils.   Denies shob, n/v/diaphoresis/cough/known sick contact.
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
## 2024-04-15 ENCOUNTER — Other Ambulatory Visit (HOSPITAL_BASED_OUTPATIENT_CLINIC_OR_DEPARTMENT_OTHER): Payer: Self-pay
